# Patient Record
Sex: Female | Born: 1960
Health system: Southern US, Community
[De-identification: ages and names within clinical notes are randomized; demographics above are authoritative.]

## PROBLEM LIST (undated history)

## (undated) DIAGNOSIS — Z87898 Personal history of other specified conditions: Secondary | ICD-10-CM

## (undated) DIAGNOSIS — M5030 Other cervical disc degeneration, unspecified cervical region: Secondary | ICD-10-CM

## (undated) DIAGNOSIS — E559 Vitamin D deficiency, unspecified: Secondary | ICD-10-CM

## (undated) DIAGNOSIS — M1990 Unspecified osteoarthritis, unspecified site: Secondary | ICD-10-CM

## (undated) DIAGNOSIS — Z8742 Personal history of other diseases of the female genital tract: Secondary | ICD-10-CM

## (undated) DIAGNOSIS — Z860100 Personal history of colon polyps, unspecified: Secondary | ICD-10-CM

## (undated) DIAGNOSIS — Z973 Presence of spectacles and contact lenses: Secondary | ICD-10-CM

## (undated) DIAGNOSIS — M7062 Trochanteric bursitis, left hip: Secondary | ICD-10-CM

## (undated) DIAGNOSIS — R112 Nausea with vomiting, unspecified: Secondary | ICD-10-CM

## (undated) DIAGNOSIS — K589 Irritable bowel syndrome without diarrhea: Secondary | ICD-10-CM

## (undated) DIAGNOSIS — Z86018 Personal history of other benign neoplasm: Secondary | ICD-10-CM

## (undated) DIAGNOSIS — K648 Other hemorrhoids: Secondary | ICD-10-CM

## (undated) DIAGNOSIS — Z9889 Other specified postprocedural states: Secondary | ICD-10-CM

## (undated) DIAGNOSIS — D1720 Benign lipomatous neoplasm of skin and subcutaneous tissue of unspecified limb: Secondary | ICD-10-CM

## (undated) DIAGNOSIS — K439 Ventral hernia without obstruction or gangrene: Secondary | ICD-10-CM

## (undated) DIAGNOSIS — Q600 Renal agenesis, unilateral: Secondary | ICD-10-CM

## (undated) HISTORY — DX: Benign lipomatous neoplasm of skin and subcutaneous tissue of unspecified limb: D17.20

## (undated) HISTORY — PX: LAPAROSCOPIC VAGINAL HYSTERECTOMY WITH SALPINGO OOPHORECTOMY: SHX6681

## (undated) HISTORY — PX: TONSILLECTOMY: SUR1361

## (undated) HISTORY — DX: Irritable bowel syndrome, unspecified: K58.9

## (undated) HISTORY — PX: OTHER SURGICAL HISTORY: SHX169

## (undated) HISTORY — PX: COLONOSCOPY: SHX174

---

## 1991-11-03 HISTORY — PX: DILATION AND CURETTAGE OF UTERUS: SHX78

## 1998-08-09 ENCOUNTER — Other Ambulatory Visit: Admission: RE | Admit: 1998-08-09 | Discharge: 1998-08-09 | Payer: Self-pay | Admitting: Gynecology

## 1999-01-14 ENCOUNTER — Ambulatory Visit (HOSPITAL_COMMUNITY): Admission: RE | Admit: 1999-01-14 | Discharge: 1999-01-14 | Payer: Self-pay | Admitting: Gastroenterology

## 1999-07-31 ENCOUNTER — Emergency Department (HOSPITAL_COMMUNITY): Admission: EM | Admit: 1999-07-31 | Discharge: 1999-08-01 | Payer: Self-pay | Admitting: *Deleted

## 1999-07-31 ENCOUNTER — Inpatient Hospital Stay (HOSPITAL_COMMUNITY): Admission: AD | Admit: 1999-07-31 | Discharge: 1999-07-31 | Payer: Self-pay | Admitting: Gynecology

## 1999-09-23 ENCOUNTER — Other Ambulatory Visit: Admission: RE | Admit: 1999-09-23 | Discharge: 1999-09-23 | Payer: Self-pay | Admitting: Gynecology

## 2000-10-05 ENCOUNTER — Other Ambulatory Visit: Admission: RE | Admit: 2000-10-05 | Discharge: 2000-10-05 | Payer: Self-pay | Admitting: Gynecology

## 2001-10-13 ENCOUNTER — Other Ambulatory Visit: Admission: RE | Admit: 2001-10-13 | Discharge: 2001-10-13 | Payer: Self-pay | Admitting: Gynecology

## 2003-04-03 ENCOUNTER — Other Ambulatory Visit: Admission: RE | Admit: 2003-04-03 | Discharge: 2003-04-03 | Payer: Self-pay | Admitting: Gynecology

## 2003-05-11 LAB — HM COLONOSCOPY: HM Colonoscopy: NORMAL

## 2003-09-10 ENCOUNTER — Emergency Department (HOSPITAL_COMMUNITY): Admission: EM | Admit: 2003-09-10 | Discharge: 2003-09-10 | Payer: Self-pay | Admitting: *Deleted

## 2004-05-08 ENCOUNTER — Other Ambulatory Visit: Admission: RE | Admit: 2004-05-08 | Discharge: 2004-05-08 | Payer: Self-pay | Admitting: Gynecology

## 2005-05-14 ENCOUNTER — Other Ambulatory Visit: Admission: RE | Admit: 2005-05-14 | Discharge: 2005-05-14 | Payer: Self-pay | Admitting: Gynecology

## 2007-01-06 ENCOUNTER — Ambulatory Visit: Payer: Self-pay

## 2007-05-11 LAB — HM PAP SMEAR

## 2008-04-05 ENCOUNTER — Encounter: Payer: Self-pay | Admitting: Gynecology

## 2008-04-05 ENCOUNTER — Ambulatory Visit (HOSPITAL_BASED_OUTPATIENT_CLINIC_OR_DEPARTMENT_OTHER): Admission: RE | Admit: 2008-04-05 | Discharge: 2008-04-06 | Payer: Self-pay | Admitting: Gynecology

## 2008-08-16 ENCOUNTER — Encounter: Admission: RE | Admit: 2008-08-16 | Discharge: 2008-08-16 | Payer: Self-pay | Admitting: Family Medicine

## 2009-11-02 DIAGNOSIS — IMO0002 Reserved for concepts with insufficient information to code with codable children: Secondary | ICD-10-CM

## 2009-11-02 HISTORY — DX: Reserved for concepts with insufficient information to code with codable children: IMO0002

## 2009-11-02 HISTORY — PX: NEPHRECTOMY LIVING DONOR: SUR877

## 2010-04-02 ENCOUNTER — Ambulatory Visit: Payer: Self-pay

## 2011-03-17 NOTE — Op Note (Signed)
[name], [name]                 ACCOUNT NO.:  [id number]   MEDICAL RECORD NO.:  [id number]          PATIENT TYPE:  AMB   LOCATION:  [facility]                         FACILITY:  [facility]   PHYSICIAN:  Timothy P. Fontaine, M.D.DATE OF BIRTH:  02/10/1961   DATE OF PROCEDURE:  04/05/2008  DATE OF DISCHARGE:                               OPERATIVE REPORT   PREOPERATIVE DIAGNOSIS:  Increasing dysmenorrhea, menorrhagia, uterine  prolapse, leiomyoma, endometriosis, left ovarian cyst   POSTOPERATIVE DIAGNOSIS:  Increasing dysmenorrhea, menorrhagia, uterine  prolapse, leiomyoma, history of endometriosis.   PROCEDURE:  Laparoscopic-assisted vaginal hysterectomy, bilateral  salpingo-oophorectomy, fulguration of endometriosis.   SURGEON:  Timothy P. Fontaine, M.D.   ASSISTANT:  Rande Brunt. Eda Paschal, M.D.   ANESTHETIC:  General.   COMPLICATIONS:  None.   ESTIMATED BLOOD LOSS:  100 mL.   SPECIMEN:  Uterus, right and left fallopian tubes, right and left  ovaries.   FINDINGS:  EUA:  External BUS, vagina normal.  Cervix normal.  Uterus  grossly normal in size, retroverted.  Adnexa without gross masses.  Laparoscopic:  Anterior cul-de-sac normal.  Posterior cul-de-sac normal.  Uterus globoid, upper limits of normal size.  Right and left fallopian  tube segments grossly normal.  Right and left ovaries grossly normal,  free and mobile.  No evidence of prior ovarian cyst.  Small pigmented  superficial peritoneal lesion consistent with endometriosis.  Left  pelvic sidewall fulgurated.  Upper abdominal exam was grossly normal.  Liver smooth, no abnormalities.  Gallbladder grossly normal.  Appendix  not visualized.   PROCEDURE:  The patient was taken to the operating room, underwent  general anesthesia, and was placed low dorsal lithotomy position.  Received and abdominal, perineal, and vaginal preparation with Betadine  solution.  EUA performed.  Hulka tenaculum placed on the cervix, and a  Foley  catheter placed in sterile technique.  The patient was draped in  the usual fashion.  A repeat transverse infraumbilical incision was  made, and using the 10-mm OptiVu laparoscope the abdomen was directly  entered without difficulty.  The abdomen was then insufflated.  Right  and left 5 mm suprapubic ports were then placed under direct  visualization after transillumination for the vessels without  difficulty.  Examination of the pelvic organs, upper abdominal exam was  carried out with findings noted above.  Using the harmonic scalpel, the  left infundibulopelvic ligament and vessels were identified.  The pelvic  sidewall anatomy was reviewed.  Ureter found to be away from the  operative site, and the pedicle was transected after clipping technique  without difficulty.  The adnexa was freed through progressive  transection with the harmonic scalpel of the broad ligament to the level  of the round ligament, which was then transected.  The anterior  peritoneal vesicouterine fold was then sharply incised using the  harmonic scalpel to the midline.  A similar procedure was carried out on  the other side.  Attention was then turned to the vaginal portion of the  case.  The patient was placed in the high dorsal lithotomy position.  The Hulka tenaculum  was removed.  A weighted speculum was placed.  Cervix grasped with a Jacobs tenaculum, and the cervical mucosa was  circumferentially injected with a lidocaine/epinephrine mixture.  The  cervical mucosa was then circumferentially incised with a scalpel, and  the paracervical planes were sharply developed.  The posterior cul-de-  sac was then sharply entered.  A long weighted speculum was placed, and  the right and left uterosacral ligaments were then clamped, cut, and  ligated using 0 Vicryl suture and tagged for future reference.  The  anterior vesicouterine plane was sharply developed, and the anterior cul-  de-sac was ultimately entered without  difficulty, and the uterus was  progressively freed from its attachments through clamping, cutting, and  ligating of the paracervical cardinal ligaments, parametrial tissues.  The uterine vessels were ligated separately bilaterally.  Ultimately,  the uterus was delivered through the vagina, and the remaining  parametrial tissues were clamped and cut, ligated using 0 Vicryl suture,  and the specimen was sent to pathology.  The long weighted speculum was  replaced with a shorter weighted speculum, and the posterior vaginal  cuff grasped with Allis clamp.  A tagged tail sponge was used to pack  the intestines from the posterior cul-de-sac, and the posterior vaginal  cuff was run from uterosacral ligament to uterosacral ligament using 0  Vicryl suture in a running-interlocking stitch.  The bowel packing was  removed, and the vagina was closed anterior to posterior using 0 Vicryl  suture in an interrupted figure-of-eight stitch.  Vagina was irrigated.  Hemostasis visualized.  Speculum was removed.  Patient placed in the low  dorsal lithotomy position.  The abdomen was reinsufflated, with some  clot noted in the right adnexa.  The right infundibulopelvic vessels  were oozing.  This was irrigated, and subsequently bipolar cautery was  applied after identifying the ureter away from the cautery site.  Ultimate hemostasis was achieved.  The pelvis was copiously irrigated.  The gas was slowly allowed to escape, and hemostasis was visualized in  all surgical areas under a low pressure situation.  The small pigmented  area in the left pelvic sidewall was superficially cauterized.  The  right and left 5 mm ports were removed.  Hemostasis was visualized, and  the infraumbilical port was then backed out under direct visualization,  showing adequate hemostasis and no evidence of hernia formation.  All  skin incisions were injected using 0.25% Marcaine.  A 0 Vicryl  subcutaneous fascial stitch was placed  infraumbilically, and all skin  incisions closed using 4-0 plain interrupted cuticular stitch.  Sterile  dressings were applied.  The patient was placed in the supine position,  awakened without difficulty, and taken to the recovery room in good  condition, having tolerated the procedure well.      Timothy P. Fontaine, M.D.  Electronically Signed     TPF/MEDQ  D:  04/05/2008  T:  04/05/2008  Job:  [id number]

## 2011-03-17 NOTE — H&P (Signed)
[name], [name]                   ACCOUNT NO.:  [id number]   MEDICAL RECORD NO.:  [id number]          PATIENT TYPE:  AMB   LOCATION:  [facility]                         FACILITY:  [facility]   PHYSICIAN:  Timothy P. Fontaine, M.D.DATE OF BIRTH:  1960-11-15   DATE OF ADMISSION:  DATE OF DISCHARGE:                              HISTORY & PHYSICAL   DATE OF SURGERY:  April 05, 2008, at 7:30, [facility].   CHIEF COMPLAINT:  1. Increasing dysmenorrhea.  2. Menorrhagia.  3. Uterine prolapse.  4. Leiomyoma.  5. History of endometriosis.  6. Left ovarian cyst.   HISTORY OF PRESENT ILLNESS:  A 50 year old G19, P2, AB 2, female,  vasectomy birth control, history of increasing dysmenorrhea,  menorrhagia, now notes something protruding through her vagina with  pressure symptoms, past history of endometriosis laparoscopically  diagnosed.  Physical exam shows some uterine prolapse.  Sonohystogram  reveals multiple small myomas, 54-mm, simple, echo-free, negative color  flow cyst, left ovary, endometrial sampling, proliferative endometrium  for LAVH/BSO.   PAST MEDICAL HISTORY:  1. IBS.  2. PMDD.   PAST SURGICAL HISTORY:  1. Tonsil and adenoidectomy.  2. D&C.  3. Laparoscopy x3.   CURRENT MEDICATIONS:  1. MiraLax.  2. Pristiq.   ALLERGIES:  No medications.   REVIEW OF SYSTEMS:  Noncontributory.   FAMILY HISTORY:  Noncontributory.   SOCIAL HISTORY:  Noncontributory.   ADMISSION PHYSICAL EXAM:  VITAL SIGNS:  Afebrile.  Stable.  HEENT:  Normal.  LUNGS:  Clear.  CARDIAC:  Regular rate.  No rubs, murmurs, or gallops.  ABDOMINAL EXAM:  Benign.  PELVIC:  External BUS and vagina normal.  Cervix grossly normal.  Uterus  retroverted, upper limits of normal size, midline and mobile.  Adnexa  without gross masses or tenderness.   ASSESSMENT:  A 50 year old G59 P2 female, vasectomy birth control,  worsening dysmenorrhea, menorrhagia, history of endometriosis.  Ultrasound, multiple  small myomas, simple echo-free cyst, 54 mm, left  ovary, negative color flow, approximately a month prior to surgery, mild  uterine prolapse with symptoms of pelvic pressure, something protruding  through the vagina for LAVH/BSO.  The proposed surgery, risks, benefits,  indications, and alternatives were reviewed with the patient to include  more conservative approaches such as hormonal manipulation, IUD,  endometrial ablation, hysterectomy.  The acute intraoperative  postoperative risks, long-term, short-term issues associated with  hysterectomy were reviewed with the patient.  She understands the  absolute irreversible sterility associated with hysterectomy and accepts  this.  The ovarian conservation issue was reviewed with her and the  options of removing both ovaries, as well as keeping both ovaries was  discussed with her and the issues with both decisions discussed.  She  understands by removing both ovaries she will become hypoestrogenic and  the long-term issues of hypoestrogenism was discussed to include  cardiovascular issues of bone health, as well as symptoms such as hot  flashes, night sweats, vaginal dryness, dyspareunia.  The options for  estrogen replacement therapy following hysterectomy was discussed and  the [facility] Data reviewed.  The increased risks of  stroke, heart attack, DVT, as well as possible increased risks of breast  cancer all discussed with her.  The options of keeping both ovaries were  reviewed and the issue of recurrent ovarian disease requiring treatment  in the future or reoperation, as well as the risk of ovarian cancer was  discussed with her.  The patient has already thought about this and  wants both ovaries removed regardless.  She does have a simple cyst on  the left ovary visualized a month ago.  She understands this may or may  not be present and that this does not necessarily obligate removing her  ovaries and again the  patient is very adamant about having both ovaries  removed and she plans on using estrogen replacement therapy following  the hysterectomy.  The issues of estrogen replacement therapy to include  administration routes were discussed and we will plan on a transdermal  route postoperatively.  Sexuality following hysterectomy was also  reviewed and the potential for persistent orgasmic dysfunction or  persistent dyspareunia following the surgery was discussed, understood,  and accepted.  The acute intraoperative postoperative risks were  reviewed to include the perioperative risks of anesthesia, DVT,  pulmonary embolus, heart attack, and stroke were reviewed.  The risks of  infection requiring prolonged antibiotics, abscess formation, or  hematoma formation requiring reoperation and drainage of abscess or  hematomas was discussed.  Incisional complications requiring opening and  draining of incisions, closure by secondary intention were all reviewed  with her.  The patient understands we will attempt a laparoscopic  approach and the issues with laparoscopy to include insufflation, trocar  placement, multiple port sites, use of a sharp blunt dissection,  electrocautery, a harmonic scalpel, possible metal clips were all  discussed with her.  She understands that any time during her procedure  we may move towards an exploratory laparotomy and a total abdominal  hysterectomy, bilateral salpingo-oophorectomy if complications arise or  it is felt in my judgment that it is unsafe to proceed with the LAVH  approach.  Long-term issues with incisions were reviewed to include  hernia formation, as well as cosmetics, keloid formation reviewed.  Hemorrhage necessitating transfusion and the risks of transfusion were  reviewed to include transfusion reaction, hepatitis, HIV, mad cow  disease, and other unknown entities.  The risks of inadvertent injury to  internal organs including bowel, bladder, ureters,  vessels, and nerves  necessitating major exploratory reparative surgeries either immediately  recognized or delay recognized, future reparative  surgeries including bladder repair, ureteral reimplantation, or ureteral  surgeries, bowel resection, ostomy formation were all discussed,  understood, and accepted.  The patient's questions were answered to her  satisfaction.  She is ready to proceed with surgery.      Timothy P. Fontaine, M.D.  Electronically Signed     TPF/MEDQ  D:  04/02/2008  T:  04/02/2008  Job:  [id number]

## 2011-03-17 NOTE — Discharge Summary (Signed)
[name], [name]                 ACCOUNT NO.:  [id number]   MEDICAL RECORD NO.:  [id number]          PATIENT TYPE:  AMB   LOCATION:  [facility]                         FACILITY:  [facility]   PHYSICIAN:  Timothy P. Fontaine, M.D.DATE OF BIRTH:  05/16/1961   DATE OF ADMISSION:  04/05/2008  DATE OF DISCHARGE:  04/06/2008                               DISCHARGE SUMMARY   DISCHARGE DIAGNOSES:  1. Pelvic relaxation with uterine prolapse.  2. Dysmenorrhea.  3. Menorrhagia.  4. Endometriosis.  5. Leiomyoma.   PROCEDURE:  Laparoscopic-assisted vaginal hysterectomy, bilateral  salpingo-oophorectomy, fulguration of endometriosis.  Pathology pending.   HOSPITAL COURSE:  A 50 year old underwent uncomplicated LAVH/BSO,  fulguration of endometriosis April 05, 2008.  The patient's postoperative  course was uncomplicated.  She was discharged on postoperative day  number 1 ambulating well, tolerating a regular diet, voiding without  difficulty with a postoperative hemoglobin of 11.5.  The patient  received precautions, instructions and followup.   DISCHARGE MEDICATIONS:  A prescription for Darvocet-N 100, number 30, 1-  2 p.o. q.4-6 h. p.r.n. pain.   FOLLOW UP:  Will be seen in the office 1 week following discharge.      Timothy P. Fontaine, M.D.  Electronically Signed     TPF/MEDQ  D:  04/06/2008  T:  04/06/2008  Job:  [id number]

## 2011-07-30 LAB — CBC
Hemoglobin: 11.5 — ABNORMAL LOW
MCV: 95.1
RDW: 13.1

## 2011-07-30 LAB — DIFFERENTIAL
Basophils Absolute: 0
Basophils Relative: 0
Lymphocytes Relative: 14
Lymphs Abs: 1.2
Monocytes Absolute: 0.4
Neutro Abs: 6.9

## 2012-03-31 ENCOUNTER — Ambulatory Visit: Payer: Self-pay | Admitting: General Practice

## 2012-05-10 ENCOUNTER — Ambulatory Visit (INDEPENDENT_AMBULATORY_CARE_PROVIDER_SITE_OTHER): Payer: Self-pay | Admitting: Internal Medicine

## 2012-05-10 ENCOUNTER — Encounter: Payer: Self-pay | Admitting: Internal Medicine

## 2012-05-10 ENCOUNTER — Other Ambulatory Visit: Payer: Self-pay | Admitting: Internal Medicine

## 2012-05-10 VITALS — BP 110/64 | HR 79 | Temp 98.6°F | Resp 16 | Ht 63.0 in | Wt 154.3 lb

## 2012-05-10 DIAGNOSIS — E663 Overweight: Secondary | ICD-10-CM

## 2012-05-10 DIAGNOSIS — Z6825 Body mass index (BMI) 25.0-25.9, adult: Secondary | ICD-10-CM

## 2012-05-10 DIAGNOSIS — D1779 Benign lipomatous neoplasm of other sites: Secondary | ICD-10-CM

## 2012-05-10 DIAGNOSIS — K589 Irritable bowel syndrome without diarrhea: Secondary | ICD-10-CM | POA: Insufficient documentation

## 2012-05-10 DIAGNOSIS — L409 Psoriasis, unspecified: Secondary | ICD-10-CM | POA: Insufficient documentation

## 2012-05-10 DIAGNOSIS — L408 Other psoriasis: Secondary | ICD-10-CM

## 2012-05-10 DIAGNOSIS — N951 Menopausal and female climacteric states: Secondary | ICD-10-CM | POA: Insufficient documentation

## 2012-05-10 DIAGNOSIS — Z780 Asymptomatic menopausal state: Secondary | ICD-10-CM

## 2012-05-10 DIAGNOSIS — M47812 Spondylosis without myelopathy or radiculopathy, cervical region: Secondary | ICD-10-CM | POA: Insufficient documentation

## 2012-05-10 DIAGNOSIS — D1720 Benign lipomatous neoplasm of skin and subcutaneous tissue of unspecified limb: Secondary | ICD-10-CM

## 2012-05-10 DIAGNOSIS — Z905 Acquired absence of kidney: Secondary | ICD-10-CM

## 2012-05-10 LAB — LIPID PANEL
HDL Cholesterol: 63 mg/dL — ABNORMAL HIGH (ref 40–60)
Ldl Cholesterol, Calc: 126 mg/dL — ABNORMAL HIGH (ref 0–100)
Triglycerides: 179 mg/dL (ref 0–200)
VLDL Cholesterol, Calc: 36 mg/dL (ref 5–40)

## 2012-05-10 LAB — COMPREHENSIVE METABOLIC PANEL
Albumin: 4 g/dL (ref 3.4–5.0)
Alkaline Phosphatase: 86 U/L (ref 50–136)
Anion Gap: 7 (ref 7–16)
BUN: 18 mg/dL (ref 7–18)
Bilirubin,Total: 0.5 mg/dL (ref 0.2–1.0)
Chloride: 102 mmol/L (ref 98–107)
Co2: 29 mmol/L (ref 21–32)
Creatinine: 1.09 mg/dL (ref 0.60–1.30)
Glucose: 86 mg/dL (ref 65–99)
Osmolality: 277 (ref 275–301)
Potassium: 4.1 mmol/L (ref 3.5–5.1)
SGOT(AST): 29 U/L (ref 15–37)
Total Protein: 7.9 g/dL (ref 6.4–8.2)

## 2012-05-10 MED ORDER — ESTRADIOL 10 MCG VA TABS: ORAL_TABLET | VAGINAL | Status: DC

## 2012-05-10 MED ORDER — ESTRADIOL 25 MCG VA TABS: 25.0000 ug | ORAL_TABLET | Freq: Every day | VAGINAL | Status: DC

## 2012-05-10 NOTE — Assessment & Plan Note (Addendum)
Prior trials of higher dose Paxil caused somnolence  prist was uneatable,and Chelex caused worsening depression . Discussed adding low doe estrogen vaginal pill to current transdermal HRT>

## 2012-05-10 NOTE — Patient Instructions (Addendum)
Consider the Low Glycemic Index Diet and 6 smaller meals daily .  This boosts your metabolism and regulates your sugars:   7 AM Low carbohydrate Protein  Shakes (EAS Carb Control  Or Atkins ,  Available everywhere,   In  cases at [employer] )  2.5 carbs  (Add or substitute a toasted sandwhich thin w/ peanut butter)  10 AM: Protein bar by Atkins (snack size,  Chocolate lover's variety at  [employer])    Lunch: sandwich on pita bread or flatbread (Joseph's makes a pita bread and a flat bread , available at [employer] and [employer]; Toufayah makes a low carb flatbread available at [employer] and [employer]) Mission makes a low carb whole wheat tortilla available at [employer] most grocery stores   3 PM:  Mid day :  Another protein bar,  Or a  cheese stick, 1/4 cup of almonds, walnuts, pistachios, pecans, peanuts,  Macadamia nuts  6 PM  Dinner:  "mean and green:"  Meat/chicken/fish, salad, and green veggie : use ranch, vinagrette,  Blue cheese, etc  9 PM snack : Breyer's low carb fudgsicle or  ice cream bar (Carb Smart), or  Weight Watcher's ice cream bar , or another protein shake  Dannon's Light n fit greek yogurt is 80 cal and 8 carbs,  Make your substitutions < 15 grams carbs   Referral to Dr.  Stephens November for your lipoma
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
## 2012-05-11 ENCOUNTER — Encounter: Payer: Self-pay | Admitting: Internal Medicine

## 2012-05-11 ENCOUNTER — Telehealth: Payer: Self-pay | Admitting: Internal Medicine

## 2012-05-11 DIAGNOSIS — Z905 Acquired absence of kidney: Secondary | ICD-10-CM | POA: Insufficient documentation

## 2012-05-11 DIAGNOSIS — E663 Overweight: Secondary | ICD-10-CM | POA: Insufficient documentation

## 2012-05-11 MED ORDER — CLOBETASOL PROPIONATE 0.05 % EX LOTN: TOPICAL_LOTION | CUTANEOUS | Status: DC

## 2012-05-11 NOTE — Assessment & Plan Note (Signed)
Secondary to patient donating one to relative.  Avoiding nephrotoxic agents. limits use of NSAIDs to once a week.

## 2012-05-11 NOTE — Assessment & Plan Note (Signed)
I have addressed  BMI and recommended a low glycemic index diet utilizing smaller more frequent meals to increase metabolism.  I have also recommended that patient start exercising with a goal of 30 minutes of aerobic exercise a minimum of 5 days per week. Screening for lipid disorders, thyroid and diabetes to be done today.   

## 2012-05-11 NOTE — Telephone Encounter (Signed)
Cholesterol is fine,  LDL is 126 HDL is 63 trigs  179.  CMET is normal:  Her fasting Glucose is fine at 86. Mg kevel is fine at 2.1 (range is normal to 2.4) on current magnesium dose;   and thyroid  normal too.

## 2012-05-11 NOTE — Assessment & Plan Note (Signed)
Trial of clobetasol lotion.
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
## 2012-05-12 NOTE — Telephone Encounter (Signed)
Patient notified, she will call back in a few weeks once the report has been scanned in to get a copy of them.

## 2012-05-24 ENCOUNTER — Encounter: Payer: Self-pay | Admitting: Internal Medicine

## 2012-07-18 ENCOUNTER — Other Ambulatory Visit: Payer: Self-pay | Admitting: Internal Medicine

## 2012-07-18 MED ORDER — PAROXETINE HCL 20 MG PO TABS: 20.0000 mg | ORAL_TABLET | ORAL | Status: DC

## 2012-07-18 MED ORDER — ESTRADIOL 0.025 MG/24HR TD PTTW: 1.0000 | MEDICATED_PATCH | TRANSDERMAL | Status: DC

## 2012-08-04 ENCOUNTER — Telehealth: Payer: Self-pay

## 2012-08-04 NOTE — Telephone Encounter (Signed)
We have only one, scanned in from [facility] .  Ok to send

## 2012-08-04 NOTE — Telephone Encounter (Signed)
[name] left a message stating that pt donated a kidney and they need all of pts creatine reports for the last year.  Please fax to Lisette Abu [phone]

## 2012-08-09 NOTE — Telephone Encounter (Signed)
Labs faxed to [name] [phone]

## 2012-12-19 LAB — HM MAMMOGRAPHY

## 2013-03-17 ENCOUNTER — Telehealth: Payer: Self-pay | Admitting: Internal Medicine

## 2013-03-17 NOTE — Telephone Encounter (Addendum)
Patient requesting to see if you will accept her two sons into our practice considering you are not taking new patients. Please advise. Other son is 53 yr old. Please advise  *[facility] has taken the 53 yr old. 5/16/arb

## 2013-03-18 NOTE — Telephone Encounter (Signed)
Yes,  I will open my practice to all family members of established patients

## 2013-03-21 NOTE — Telephone Encounter (Signed)
Left a message on Soley's home phone stating [facility] would accept the 53 yr old son.

## 2013-05-10 ENCOUNTER — Encounter: Payer: Self-pay | Admitting: Internal Medicine

## 2013-05-10 ENCOUNTER — Ambulatory Visit (INDEPENDENT_AMBULATORY_CARE_PROVIDER_SITE_OTHER): Payer: 59 | Admitting: Internal Medicine

## 2013-05-10 VITALS — BP 103/70 | HR 64 | Temp 98.1°F | Resp 14 | Ht 63.0 in | Wt 139.5 lb

## 2013-05-10 DIAGNOSIS — N951 Menopausal and female climacteric states: Secondary | ICD-10-CM

## 2013-05-10 DIAGNOSIS — K589 Irritable bowel syndrome without diarrhea: Secondary | ICD-10-CM

## 2013-05-10 DIAGNOSIS — Z1211 Encounter for screening for malignant neoplasm of colon: Secondary | ICD-10-CM

## 2013-05-10 DIAGNOSIS — Z79890 Hormone replacement therapy (postmenopausal): Secondary | ICD-10-CM

## 2013-05-10 DIAGNOSIS — E663 Overweight: Secondary | ICD-10-CM

## 2013-05-10 DIAGNOSIS — Z6825 Body mass index (BMI) 25.0-25.9, adult: Secondary | ICD-10-CM

## 2013-05-10 DIAGNOSIS — Z0000 Encounter for general adult medical examination without abnormal findings: Secondary | ICD-10-CM

## 2013-05-10 NOTE — Progress Notes (Signed)
Patient ID: [name], female   DOB: [birth year], 52 y.o.   MRN: [id number]   Subjective:     [name] is a 52 y.o. female and is here for a comprehensive physical exam. The patient reports no problems.  History   Social History  . Marital Status: Married    Spouse Name: N/A    Number of Children: N/A  . Years of Education: N/A   Occupational History  . Not on file.   Social History Main Topics  . Smoking status: Never Smoker   . Smokeless tobacco: Never Used  . Alcohol Use: Yes  . Drug Use: No  . Sexually Active: Not on file   Other Topics Concern  . Not on file   Social History Narrative  . No narrative on file   Health Maintenance  Topic Date Due  . Tetanus/tdap  04/12/1979  . Pap Smear  05/10/2010  . Mammogram  05/10/2013  . Colonoscopy  05/10/2013  . Influenza Vaccine  07/03/2013    The following portions of the patient's history were reviewed and updated as appropriate: allergies, current medications, past family history, past medical history, past social history, past surgical history and problem list.  Review of Systems A comprehensive review of systems was negative.   Objective:   BP 103/70  Pulse 64  Temp(Src) 98.1 F (36.7 C) (Oral)  Resp 14  Ht 5\' 3"  (1.6 m)  Wt 139 lb 8 oz (63.277 kg)  BMI 24.72 kg/m2  SpO2 98%  General Appearance:    Alert, cooperative, no distress, appears stated age  Head:    Normocephalic, without obvious abnormality, atraumatic  Eyes:    PERRL, conjunctiva/corneas clear, EOM's intact, fundi    benign, both eyes  Ears:    Normal TM's and external ear canals, both ears  Nose:   Nares normal, septum midline, mucosa normal, no drainage    or sinus tenderness  Throat:   Lips, mucosa, and tongue normal; teeth and gums normal  Neck:   Supple, symmetrical, trachea midline, no adenopathy;    thyroid:  no enlargement/tenderness/nodules; no carotid   bruit or JVD  Back:     Symmetric, no curvature, ROM normal, no CVA  tenderness  Lungs:     Clear to auscultation bilaterally, respirations unlabored  Chest Wall:    No tenderness or deformity   Heart:    Regular rate and rhythm, S1 and S2 normal, no murmur, rub   or gallop  Breast Exam:    No tenderness, masses, or nipple abnormality  Abdomen:     Soft, non-tender, bowel sounds active all four quadrants,    no masses, no organomegaly  Genitalia:    Pelvic:no adnexal masses or tenderness, no cervical motion tenderness, rectovaginal septum normal, uterus surgically absent   Extremities:   Extremities normal, atraumatic, no cyanosis or edema  Pulses:   2+ and symmetric all extremities  Skin:   Skin color, texture, turgor normal, no rashes or lesions large mobile lipoma , left anterior thigh  Lymph nodes:   Cervical, supraclavicular, and axillary nodes normal  Neurologic:   CNII-XII intact, normal strength, sensation and reflexes    throughout    Assessment:   Overweight (BMI 25.0-29.9) Resolved with wt loss of 25 lbs on a 800 cal low carb diet imposed by Dr. Evelene Miller.  Achieved without regular exercise. Diet discussed,  The cost was prohibitive .  Alternatives given.   IBS (irritable bowel syndrome) Given several dietary suggestions to increased  daily fiber intake to 25 to 35 g daily. Need for screening colonoscopy discussed again and referral made to Dr Bluford Kaufmann  Menopausal syndrome on hormone replacement therapy Her Vivelle patch is  Causing local irritation regularly prior to changing.  Discussed trial of daily oral estradiol as she is s/p TAH  Routine general medical examination at a health care facility Annual comprehensive exam was done including breast, pelvic without  PAP smear. All screenings have been addressed .    Updated Medication List Outpatient Encounter Prescriptions as of 05/10/2013  Medication Sig Dispense Refill  . estradiol (VIVELLE-DOT) 0.025 MG/24HR Place 1 patch onto the skin once a week.  12 patch  3  . ibuprofen (ADVIL,MOTRIN)  200 MG tablet Take 200 mg by mouth every 6 (six) hours as needed.      . loratadine (CLARITIN) 10 MG tablet Take 10 mg by mouth daily.      . magnesium oxide (MAG-OX) 400 MG tablet Take 400 mg by mouth 2 (two) times daily.      . Misc Natural Products (OSTEO BI-FLEX ADV DOUBLE ST) CAPS Take by mouth.      Marland Kitchen PARoxetine (PAXIL) 20 MG tablet Take 1 tablet (20 mg total) by mouth every morning.  30 tablet  3  . Potassium 99 MG TABS Take by mouth 2 (two) times daily.      . Clobetasol Propionate 0.05 % lotion Apply to affected area two times daily as needed  59 mL  0  . estradiol (ESTRACE) 1 MG tablet Take 1 tablet (1 mg total) by mouth daily.  90 tablet  1  . Estradiol (VAGIFEM) 10 MCG TABS Place vaginally daily for two weeks, then one weekly  18 tablet  0  . Methylsulfonylmethane (MSM) 1000 MG CAPS Take by mouth.       No facility-administered encounter medications on file as of 05/10/2013.

## 2013-05-10 NOTE — Patient Instructions (Addendum)
GET YOUR MAMMOMGRAM!!  Referral to Dr Bluford Kaufmann for your colonoscopy  This is my version of a  "Low GI"  Diet:     All of the foods can be found at grocery stores and in bulk at [employer].  The Atkins protein bars and shakes are available in more varieties at Target, WalMart and [employer] Foods.     7 AM Breakfast:  Choose from the following:  Low carbohydrate Protein  Shakes (I recommend the EAS AdvantEdge "Carb Control" shakes  Or the low carb shakes by Atkins.    2.5 carbs   Arnold's "Sandwhich Thin"toasted  w/ peanut butter (no jelly: about 20 net carbs  "Bagel Thin" with cream cheese and salmon: about 20 carbs   a scrambled egg/bacon/cheese burrito made with Mission's "carb balance" whole wheat tortilla  (about 10 net carbs )   Avoid cereal and bananas, oatmeal and cream of wheat and grits. They are loaded with carbohydrates!   10 AM: high protein snack  Protein bar by Atkins (the snack size, under 200 cal, usually < 6 net carbs).    A stick of cheese:  Around 1 carb,  100 cal     Dannon Light n Fit [place] Yogurt  (80 cal, 8 carbs)  Other so called "protein bars" and Greek yogurts tend to be loaded with carbohydrates.  Remember, in food advertising, the word "energy" is synonymous for " carbohydrate."  Lunch:   A Sandwich using the bread choices listed, Can use any  Eggs,  lunchmeat, grilled meat or canned tuna), avocado, regular mayo/mustard  and cheese.  A Salad using blue cheese, ranch,  Goddess or vinagrette,  No croutons or "confetti" and no "candied nuts" but regular nuts OK.   No pretzels or chips.  Pickles and miniature sweet peppers are a good low carb alternative that provide a "crunch"  The bread is the only source of carbohydrate in a sandwich and  can be decreased by trying some of these alternatives to traditional loaf bread  Joseph's makes a pita bread and a flat bread that are 50 cal and 4 net carbs available at BJs and WalMart.  This can be toasted to use with hummous as  well  Toufayan makes a low carb flatbread that's 100 cal and 9 net carbs available at [employer] and [employer] makes 2 sizes of  Low carb whole wheat tortilla  (The large one is 210 cal and 6 net carbs) Avoid "Low fat dressings, as well as Reyne Dumas and [address] dressings They are loaded with sugar!   3 PM/ Mid day  Snack:  Consider  1 ounce of  almonds, walnuts, pistachios, pecans, peanuts,  Macadamia nuts or a nut medley.  Avoid "granola"; the dried cranberries and raisins are loaded with carbohydrates. Mixed nuts as long as there are no raisins,  cranberries or dried fruit.     6 PM  Dinner:     Meat/fowl/fish with a green salad, and either broccoli, cauliflower, green beans, spinach, brussel sprouts or  Lima beans. DO NOT BREAD THE PROTEIN!!      There is a low carb pasta by Dreamfield's that is acceptable and tastes great: only 5 digestible carbs/serving.( All grocery stores but BJs carry it )  Try Kai Levins Angelo's chicken piccata or chicken or eggplant parm over low carb pasta.(Lowes and BJs)   Clifton Custard Sanchez's "Carnitas" (pulled pork, no sauce,  0 carbs) or his beef pot roast to make a dinner burrito (  at BJ's)  Pesto over low carb pasta (bj's sells a good quality pesto in the center refrigerated section of the deli   Whole wheat pasta is still full of digestible carbs and  Not as low in glycemic index as Dreamfield's.   Brown rice is still rice,  So skip the rice and noodles if you eat [place] or [place] (or at least limit to 1/2 cup)  9 PM snack :   Breyer's "low carb" fudgsicle or  ice cream bar (Carb Smart line), or  Weight Watcher's ice cream bar , or another "no sugar added" ice cream;  a serving of fresh berries/cherries with whipped cream   Cheese or DANNON'S LlGHT N FIT [place] YOGURT  Avoid bananas, pineapple, grapes  and watermelon on a regular basis because they are high in sugar.  THINK OF THEM AS DESSERT  Remember that snack Substitutions should be less than 10 NET  carbs per serving and meals < 20 carbs. Remember to subtract fiber grams to get the "net carbs."

## 2013-05-11 ENCOUNTER — Encounter: Payer: Self-pay | Admitting: Internal Medicine

## 2013-05-11 DIAGNOSIS — Z0000 Encounter for general adult medical examination without abnormal findings: Secondary | ICD-10-CM | POA: Insufficient documentation

## 2013-05-11 MED ORDER — ESTRADIOL 1 MG PO TABS: 1.0000 mg | ORAL_TABLET | Freq: Every day | ORAL | Status: DC

## 2013-05-11 NOTE — Assessment & Plan Note (Signed)
Her Vivelle patch is  Causing local irritation regularly prior to changing.  Discussed trial of daily oral estradiol as she is s/p TAH

## 2013-05-11 NOTE — Assessment & Plan Note (Signed)
Resolved with wt loss of 25 lbs on a 800 cal low carb diet imposed by Dr. Evelene Croon.  Achieved without regular exercise. Diet discussed,  The cost was prohibitive .  Alternatives given.

## 2013-05-11 NOTE — Assessment & Plan Note (Signed)
Annual comprehensive exam was done including breast, pelvic without PAP smear. All screenings have been addressed .  

## 2013-05-11 NOTE — Assessment & Plan Note (Signed)
Given several dietary suggestions to increased daily fiber intake to 25 to 35 g daily. Need for screening colonoscopy discussed again and referral made to Dr Bluford Kaufmann

## 2013-07-18 ENCOUNTER — Other Ambulatory Visit: Payer: Self-pay | Admitting: Internal Medicine

## 2013-07-18 LAB — LIPID PANEL
Ldl Cholesterol, Calc: 99 mg/dL (ref 0–100)
Triglycerides: 114 mg/dL (ref 0–200)
VLDL Cholesterol, Calc: 23 mg/dL (ref 5–40)

## 2013-07-18 LAB — TSH: Thyroid Stimulating Horm: 1.23 u[IU]/mL

## 2013-07-18 LAB — CBC WITH DIFFERENTIAL/PLATELET
Eosinophil #: 0.1 10*3/uL (ref 0.0–0.7)
HGB: 14.2 g/dL (ref 12.0–16.0)
Lymphocyte #: 1.5 10*3/uL (ref 1.0–3.6)
Lymphocyte %: 26.5 %
MCH: 32.8 pg (ref 26.0–34.0)
MCV: 92 fL (ref 80–100)
Neutrophil #: 3.7 10*3/uL (ref 1.4–6.5)
Platelet: 166 10*3/uL (ref 150–440)
RBC: 4.34 10*6/uL (ref 3.80–5.20)
WBC: 5.6 10*3/uL (ref 3.6–11.0)

## 2013-07-18 LAB — COMPREHENSIVE METABOLIC PANEL
Alkaline Phosphatase: 83 U/L (ref 50–136)
Anion Gap: 3 — ABNORMAL LOW (ref 7–16)
BUN: 17 mg/dL (ref 7–18)
Bilirubin,Total: 0.5 mg/dL (ref 0.2–1.0)
Calcium, Total: 9.1 mg/dL (ref 8.5–10.1)
Creatinine: 0.93 mg/dL (ref 0.60–1.30)
EGFR (African American): >60
EGFR (Non-African Amer.): >60
Osmolality: 275 (ref 275–301)
Potassium: 4.3 mmol/L (ref 3.5–5.1)
SGOT(AST): 27 U/L (ref 15–37)
Sodium: 137 mmol/L (ref 136–145)
Total Protein: 7.3 g/dL (ref 6.4–8.2)

## 2013-07-20 ENCOUNTER — Telehealth: Payer: Self-pay | Admitting: Internal Medicine

## 2013-07-20 LAB — BASIC METABOLIC PANEL
Creatinine: 0.9 mg/dL (ref 0.5–1.1)
Potassium: 4.3 mmol/L (ref 3.4–5.3)
Sodium: 137 mmol/L (ref 137–147)

## 2013-07-20 LAB — LIPID PANEL: HDL: 81 mg/dL — AB (ref 35–70)

## 2013-07-20 LAB — CBC AND DIFFERENTIAL
HCT: 40 % (ref 36–46)
WBC: 5.6 10^3/mL

## 2013-07-20 LAB — HEPATIC FUNCTION PANEL: ALT: 28 U/L (ref 7–35)

## 2013-07-20 NOTE — Telephone Encounter (Signed)
All labs normal,. Including thyroid function and cholesterol   

## 2013-07-27 NOTE — Telephone Encounter (Signed)
Left message, notifying of results.  

## 2013-08-03 ENCOUNTER — Encounter: Payer: Self-pay | Admitting: Internal Medicine

## 2013-08-15 ENCOUNTER — Other Ambulatory Visit: Payer: Self-pay | Admitting: *Deleted

## 2013-08-16 MED ORDER — PAROXETINE HCL 20 MG PO TABS: 20.0000 mg | ORAL_TABLET | ORAL | Status: DC

## 2013-09-12 ENCOUNTER — Encounter: Payer: Self-pay | Admitting: Adult Health

## 2013-09-12 ENCOUNTER — Ambulatory Visit (INDEPENDENT_AMBULATORY_CARE_PROVIDER_SITE_OTHER): Payer: 59 | Admitting: Adult Health

## 2013-09-12 VITALS — BP 122/80 | HR 76 | Resp 16 | Wt 146.0 lb

## 2013-09-12 DIAGNOSIS — R51 Headache: Secondary | ICD-10-CM

## 2013-09-12 DIAGNOSIS — L409 Psoriasis, unspecified: Secondary | ICD-10-CM

## 2013-09-12 DIAGNOSIS — L408 Other psoriasis: Secondary | ICD-10-CM

## 2013-09-12 LAB — CBC WITH DIFFERENTIAL/PLATELET
Basophils Relative: 0.5 % (ref 0.0–3.0)
Hemoglobin: 13.7 g/dL (ref 12.0–15.0)
Lymphocytes Relative: 31.8 % (ref 12.0–46.0)
Lymphs Abs: 1.8 10*3/uL (ref 0.7–4.0)
MCHC: 34.7 g/dL (ref 30.0–36.0)
Monocytes Relative: 6.2 % (ref 3.0–12.0)
Neutro Abs: 3.3 10*3/uL (ref 1.4–7.7)
Platelets: 194 10*3/uL (ref 150.0–400.0)
WBC: 5.6 10*3/uL (ref 4.5–10.5)

## 2013-09-12 LAB — HEPATIC FUNCTION PANEL: ALT: 21 U/L (ref 0–35)

## 2013-09-12 MED ORDER — CLOBETASOL PROPIONATE 0.05 % EX LOTN: TOPICAL_LOTION | CUTANEOUS | Status: DC

## 2013-09-12 NOTE — Progress Notes (Signed)
  Subjective:    Patient ID: [name], female    DOB: January 09, 1961, 52 y.o.   MRN: [id number]  HPI  Pt is a pleasant 52 yo female, presents to clinic with left temporal pain x 2 weeks.  Pt states it started suddenly when she was at work and has not resolved. Pt has been taking Advil and Sudafed with minimal relief. She is experiencing pain radiating to her jaw. Blurred vision in left eye. Reports eye exam 3 weeks ago; however, symptoms occurred later.  Review of Systems  Constitutional: Negative.   Eyes: Positive for visual disturbance.       Blurred vision, left eye  Neurological: Positive for headaches.       Left temporal pain.       Objective:   Physical Exam  Constitutional: She is oriented to person, place, and time. She appears well-developed and well-nourished.  HENT:  Head: Normocephalic and atraumatic.  Right Ear: Tympanic membrane, external ear and ear canal normal.  Left Ear: Tympanic membrane, external ear and ear canal normal.  Neurological: She is alert and oriented to person, place, and time.  Skin: Skin is warm and dry.  Psychiatric: She has a normal mood and affect. Her behavior is normal. Judgment and thought content normal.    BP 122/80  Pulse 76  Wt 146 lb (66.225 kg)  SpO2 98%       Assessment & Plan:

## 2013-09-12 NOTE — Assessment & Plan Note (Signed)
Left temporal pain. R/O temporal arteritis - check sed rate, crp, hepatic panel, cbc

## 2013-09-13 ENCOUNTER — Telehealth: Payer: Self-pay | Admitting: Internal Medicine

## 2013-09-13 NOTE — Telephone Encounter (Signed)
Pt seen by Raquel 11/11.  Pt would like to speak with Raquel or nurse regarding her labs and the problem that she came in with yesterday, pain in temple.

## 2013-09-13 NOTE — Telephone Encounter (Signed)
Spoke with pt and discussed labs were normal. Advised on what Raquel suggested. Pt states she spoke with a physician she works with, and believe this is temporal arteritis and would like referral to Dr. Gavin Potters.

## 2013-09-14 ENCOUNTER — Other Ambulatory Visit: Payer: Self-pay | Admitting: Adult Health

## 2013-09-14 NOTE — Progress Notes (Signed)
Patient requesting referral to rheumatology for left temporal pain. Sed rate, CRP, LFTs normal. She works with a physician who believes this is temporal arteritis; therefore, patient wants to see Dr. Gavin Potters. Referral made

## 2013-09-18 ENCOUNTER — Ambulatory Visit: Payer: Self-pay | Admitting: Gastroenterology

## 2013-10-04 ENCOUNTER — Encounter (INDEPENDENT_AMBULATORY_CARE_PROVIDER_SITE_OTHER): Payer: Self-pay

## 2013-10-04 ENCOUNTER — Ambulatory Visit (INDEPENDENT_AMBULATORY_CARE_PROVIDER_SITE_OTHER): Payer: 59 | Admitting: Adult Health

## 2013-10-04 VITALS — BP 126/78 | HR 66 | Temp 97.8°F | Wt 146.0 lb

## 2013-10-04 DIAGNOSIS — R51 Headache: Secondary | ICD-10-CM

## 2013-10-04 MED ORDER — HYDROCODONE-ACETAMINOPHEN 5-325 MG PO TABS: 1.0000 | ORAL_TABLET | Freq: Four times a day (QID) | ORAL | Status: DC | PRN

## 2013-10-04 NOTE — Assessment & Plan Note (Signed)
Neuro exam normal. Suspect pain is directly related to recent dental procedure. Try ice alternating with warm compresses. Norco for pain. If no resolution of symptoms within 2-3 weeks will send for MRI.

## 2013-10-04 NOTE — Progress Notes (Signed)
Pre visit review using our clinic review tool, if applicable. No additional management support is needed unless otherwise documented below in the visit note. 

## 2013-10-04 NOTE — Patient Instructions (Signed)
  Instructions follow on how to activate your MyChart account so that you can access your medical records.  Try putting ice for 15 min - you can also use warm compresses.  Take Norco 1 tablet every 6 hours as needed for pain.

## 2013-10-04 NOTE — Progress Notes (Signed)
   Subjective:    Patient ID: [name], female    DOB: 1961/07/05, 52 y.o.   MRN: [id number]  HPI Patient is a pleasant 52 year old female who presents to clinic with left side facial pain. She was seen in clinic several weeks ago with left temporal pain status post inflammatory markers which were negative. Patient reports that she has had dental work 2 weeks ago and subsequently has developed the pain on the left side of her face. She reports having a nerve block on that side for her dental procedure. She also reports history of TMJ. Needs a new mouthguard. Also reports grandmother with history of tic douloureux.   Review of Systems  Neurological: Negative for facial asymmetry, numbness and headaches.       Left side facial pain       Objective:   Physical Exam  Constitutional: She is oriented to person, place, and time.  Neurological: She is alert and oriented to person, place, and time. She has normal strength. No cranial nerve deficit or sensory deficit.          Assessment & Plan:

## 2013-10-07 LAB — HM COLONOSCOPY

## 2013-10-10 ENCOUNTER — Ambulatory Visit: Payer: Self-pay | Admitting: Internal Medicine

## 2013-10-18 ENCOUNTER — Encounter: Payer: Self-pay | Admitting: Adult Health

## 2013-10-27 ENCOUNTER — Ambulatory Visit: Payer: Self-pay | Admitting: Internal Medicine

## 2013-10-31 ENCOUNTER — Telehealth: Payer: Self-pay | Admitting: Internal Medicine

## 2013-10-31 NOTE — Telephone Encounter (Signed)
Her MRA Was normal

## 2013-11-01 ENCOUNTER — Other Ambulatory Visit: Payer: Self-pay | Admitting: Adult Health

## 2013-11-01 MED ORDER — PREDNISONE 10 MG PO TABS: ORAL_TABLET | ORAL | Status: DC

## 2013-11-01 NOTE — Telephone Encounter (Signed)
Called and advised patient of results

## 2013-11-01 NOTE — Telephone Encounter (Signed)
Pt states continues to have the constant left temple pain, relieved by Advil, but does not want to take all the time. Wants to know what her next step is?

## 2013-11-01 NOTE — Telephone Encounter (Signed)
Since she saw raquel,  Please ask raquel what her next plan was,. otherwise patient will have to see  Me to determine next course of action

## 2013-11-03 NOTE — Telephone Encounter (Signed)
Left message, notifying pt of unread message and to call to schedule followup appt with Dr. Derrel Nip

## 2013-11-13 ENCOUNTER — Encounter: Payer: Self-pay | Admitting: Internal Medicine

## 2013-11-24 ENCOUNTER — Encounter: Payer: Self-pay | Admitting: Internal Medicine

## 2014-01-17 ENCOUNTER — Other Ambulatory Visit: Payer: Self-pay | Admitting: Internal Medicine

## 2014-03-16 ENCOUNTER — Telehealth: Payer: Self-pay | Admitting: Internal Medicine

## 2014-03-16 ENCOUNTER — Other Ambulatory Visit: Payer: Self-pay | Admitting: Adult Health

## 2014-03-16 DIAGNOSIS — R519 Headache, unspecified: Secondary | ICD-10-CM

## 2014-03-16 DIAGNOSIS — R51 Headache: Principal | ICD-10-CM

## 2014-03-16 NOTE — Telephone Encounter (Signed)
Would like referral for Rheumatology please advise.

## 2014-03-16 NOTE — Telephone Encounter (Signed)
This patient needs to be seen by me before any referral is done. . It has been too long since her last visit.  And the issue needs to be reevalulated by me

## 2014-03-16 NOTE — Telephone Encounter (Signed)
Since I did not see this patient for this problem, I cannot do the referral.  I  will send it to Raquel to consider rheumatology vs  neurology (raquel could this be trigeminal neuralgia?)

## 2014-03-16 NOTE — Telephone Encounter (Signed)
Pt states she was referred to Dr. Precious Reel previously, which she cancelled due to being told they may not be able to help with her problem.  States she is still having trouble and would like the referral again.  Best day to go is a Wednesday, any time of day.

## 2014-03-16 NOTE — Telephone Encounter (Signed)
I'll put in the referral. She should have never cancelled

## 2014-03-19 NOTE — Telephone Encounter (Signed)
Left detailed message on voicemail, patient needs to call offie and request appointment before referral is made.

## 2014-03-28 ENCOUNTER — Ambulatory Visit (INDEPENDENT_AMBULATORY_CARE_PROVIDER_SITE_OTHER): Payer: 59 | Admitting: Internal Medicine

## 2014-03-28 ENCOUNTER — Encounter: Payer: Self-pay | Admitting: Internal Medicine

## 2014-03-28 VITALS — BP 116/76 | HR 71 | Temp 98.5°F | Resp 16 | Wt 151.0 lb

## 2014-03-28 DIAGNOSIS — M130 Polyarthritis, unspecified: Secondary | ICD-10-CM | POA: Insufficient documentation

## 2014-03-28 LAB — COMPREHENSIVE METABOLIC PANEL
ALBUMIN: 3.9 g/dL (ref 3.5–5.2)
ALK PHOS: 58 U/L (ref 39–117)
ALT: 24 U/L (ref 0–35)
AST: 23 U/L (ref 0–37)
BUN: 22 mg/dL (ref 6–23)
CO2: 31 meq/L (ref 19–32)
Calcium: 9.2 mg/dL (ref 8.4–10.5)
Chloride: 99 mEq/L (ref 96–112)
Creatinine, Ser: 0.9 mg/dL (ref 0.4–1.2)
GFR: 68.48 mL/min (ref >60.00)
Glucose, Bld: 73 mg/dL (ref 70–99)
Potassium: 4.1 mEq/L (ref 3.5–5.1)
SODIUM: 137 meq/L (ref 135–145)
TOTAL PROTEIN: 6.5 g/dL (ref 6.0–8.3)
Total Bilirubin: 0.6 mg/dL (ref 0.2–1.2)

## 2014-03-28 LAB — CBC WITH DIFFERENTIAL/PLATELET
BASOS PCT: 0.4 % (ref 0.0–3.0)
Basophils Absolute: 0 10*3/uL (ref 0.0–0.1)
EOS ABS: 0.1 10*3/uL (ref 0.0–0.7)
EOS PCT: 1.6 % (ref 0.0–5.0)
HCT: 39.3 % (ref 36.0–46.0)
HEMOGLOBIN: 13.7 g/dL (ref 12.0–15.0)
Lymphocytes Relative: 31 % (ref 12.0–46.0)
Lymphs Abs: 1.5 10*3/uL (ref 0.7–4.0)
MCHC: 34.8 g/dL (ref 30.0–36.0)
MCV: 94.2 fl (ref 78.0–100.0)
MONO ABS: 0.4 10*3/uL (ref 0.1–1.0)
Monocytes Relative: 8 % (ref 3.0–12.0)
NEUTROS ABS: 2.9 10*3/uL (ref 1.4–7.7)
Neutrophils Relative %: 59 % (ref 43.0–77.0)
Platelets: 189 10*3/uL (ref 150.0–400.0)
RBC: 4.18 Mil/uL (ref 3.87–5.11)
RDW: 13.8 % (ref 11.5–15.5)
WBC: 4.9 10*3/uL (ref 4.0–10.5)

## 2014-03-28 LAB — C-REACTIVE PROTEIN: CRP: 0.7 mg/dL (ref 0.5–20.0)

## 2014-03-28 LAB — SEDIMENTATION RATE: SED RATE: 6 mm/h (ref 0–22)

## 2014-03-28 LAB — URIC ACID: URIC ACID, SERUM: 4.1 mg/dL (ref 2.4–7.0)

## 2014-03-28 LAB — FERRITIN: Ferritin: 24.6 ng/mL (ref 10.0–291.0)

## 2014-03-28 MED ORDER — TRAMADOL HCL 50 MG PO TABS: 50.0000 mg | ORAL_TABLET | Freq: Three times a day (TID) | ORAL | Status: DC | PRN

## 2014-03-28 NOTE — Patient Instructions (Signed)
You can continue ibuprofen 400 mg twice daily   You can add tylenol or tramadol to the ibuprofen for pain if needed    Referral to Dr Jefm Bryant under way

## 2014-03-28 NOTE — Progress Notes (Signed)
Pre-visit discussion using our clinic review tool. No additional management support is needed unless otherwise documented below in the visit note.  
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
## 2014-03-29 LAB — LYME ABY, WSTRN BLT IGG & IGM W/BANDS
B burgdorferi IgG Abs (IB): NEGATIVE
B burgdorferi IgM Abs (IB): NEGATIVE
LYME DISEASE 23 KD IGM: NONREACTIVE
LYME DISEASE 30 KD IGG: NONREACTIVE
LYME DISEASE 41 KD IGG: NONREACTIVE
LYME DISEASE 45 KD IGG: NONREACTIVE
LYME DISEASE 58 KD IGG: NONREACTIVE
LYME DISEASE 66 KD IGG: NONREACTIVE
Lyme Disease 18 kD IgG: NONREACTIVE
Lyme Disease 23 kD IgG: NONREACTIVE
Lyme Disease 28 kD IgG: REACTIVE — AB
Lyme Disease 39 kD IgG: REACTIVE — AB
Lyme Disease 39 kD IgM: NONREACTIVE
Lyme Disease 41 kD IgM: NONREACTIVE
Lyme Disease 93 kD IgG: NONREACTIVE

## 2014-03-29 LAB — ANA W/REFLEX IF POSITIVE
Anti JO-1: <0.2 AI (ref 0.0–0.9)
Anti Nuclear Antibody(ANA): POSITIVE — AB
Centromere Ab Screen: <0.2 AI (ref 0.0–0.9)
ENA RNP AB: 2.5 AI — AB (ref 0.0–0.9)
ENA SM AB SER-ACNC: 0.4 AI (ref 0.0–0.9)
ENA SSA (RO) AB: 0.2 AI (ref 0.0–0.9)
Scleroderma SCL-70: <0.2 AI (ref 0.0–0.9)
dsDNA Ab: 3 IU/mL (ref 0–9)

## 2014-03-29 LAB — CYCLIC CITRUL PEPTIDE ANTIBODY, IGG

## 2014-03-29 LAB — IRON AND TIBC
%SAT: 30 % (ref 20–55)
Iron: 103 ug/dL (ref 42–145)
TIBC: 348 ug/dL (ref 250–470)
UIBC: 245 ug/dL (ref 125–400)

## 2014-03-29 LAB — RHEUMATOID FACTOR

## 2014-03-30 ENCOUNTER — Encounter: Payer: Self-pay | Admitting: Internal Medicine

## 2014-03-30 LAB — HLA-B27 ANTIGEN: DNA Result:: NOT DETECTED

## 2014-03-30 NOTE — Assessment & Plan Note (Addendum)
Repeat workup for autoimmune arthritis resulted in a  positive screen for past exposure to Lyme's disease,  And positive ANA  With ENA RNP Ab level being high, suggesting a mixed connective tissue disease (which is not specific any one one disease). Referral to rheumatology is  Underway.

## 2014-04-03 NOTE — Telephone Encounter (Signed)
Mailed unread message to pt  

## 2014-07-23 ENCOUNTER — Other Ambulatory Visit: Payer: Self-pay | Admitting: Internal Medicine

## 2014-12-05 ENCOUNTER — Ambulatory Visit (INDEPENDENT_AMBULATORY_CARE_PROVIDER_SITE_OTHER): Payer: 59 | Admitting: Internal Medicine

## 2014-12-05 ENCOUNTER — Encounter: Payer: Self-pay | Admitting: Internal Medicine

## 2014-12-05 VITALS — BP 134/72 | HR 73 | Temp 98.4°F | Resp 16 | Ht 62.75 in | Wt 141.2 lb

## 2014-12-05 DIAGNOSIS — M25552 Pain in left hip: Secondary | ICD-10-CM

## 2014-12-05 DIAGNOSIS — E785 Hyperlipidemia, unspecified: Secondary | ICD-10-CM

## 2014-12-05 DIAGNOSIS — E559 Vitamin D deficiency, unspecified: Secondary | ICD-10-CM

## 2014-12-05 DIAGNOSIS — Z1239 Encounter for other screening for malignant neoplasm of breast: Secondary | ICD-10-CM

## 2014-12-05 DIAGNOSIS — Z0000 Encounter for general adult medical examination without abnormal findings: Secondary | ICD-10-CM

## 2014-12-05 DIAGNOSIS — G8929 Other chronic pain: Secondary | ICD-10-CM

## 2014-12-05 DIAGNOSIS — Z0189 Encounter for other specified special examinations: Secondary | ICD-10-CM

## 2014-12-05 DIAGNOSIS — Z79890 Hormone replacement therapy (postmenopausal): Secondary | ICD-10-CM

## 2014-12-05 DIAGNOSIS — R202 Paresthesia of skin: Secondary | ICD-10-CM

## 2014-12-05 DIAGNOSIS — N951 Menopausal and female climacteric states: Secondary | ICD-10-CM

## 2014-12-05 DIAGNOSIS — L409 Psoriasis, unspecified: Secondary | ICD-10-CM | POA: Insufficient documentation

## 2014-12-05 DIAGNOSIS — Z13820 Encounter for screening for osteoporosis: Secondary | ICD-10-CM

## 2014-12-05 DIAGNOSIS — E663 Overweight: Secondary | ICD-10-CM

## 2014-12-05 MED ORDER — FLUOCINOLONE ACETONIDE 0.01 % OT OIL: TOPICAL_OIL | OTIC | Status: DC

## 2014-12-05 NOTE — Progress Notes (Signed)
Pre-visit discussion using our clinic review tool. No additional management support is needed unless otherwise documented below in the visit note.  

## 2014-12-05 NOTE — Progress Notes (Signed)
Patient ID: [name], female   DOB: 07-Apr-1961, 54 y.o.   MRN: [id number]  Subjective:     [name] is a 54 y.o. female and is here for a comprehensive physical exam. The patient reports no problems.  History   Social History  . Marital Status: Married    Spouse Name: N/A    Number of Children: N/A  . Years of Education: N/A   Occupational History  . Not on file.   Social History Main Topics  . Smoking status: Never Smoker   . Smokeless tobacco: Never Used  . Alcohol Use: Yes  . Drug Use: No  . Sexual Activity: Not on file   Other Topics Concern  . Not on file   Social History Narrative   Health Maintenance  Topic Date Due  . Samul Dada  04/12/1979  . PAP SMEAR  05/10/2010  . INFLUENZA VACCINE  06/03/2015  . MAMMOGRAM  10/11/2015  . COLONOSCOPY  09/19/2023    The following portions of the patient's history were reviewed and updated as appropriate: allergies, current medications, past family history, past medical history, past social history, past surgical history and problem list.  Review of Systems  Patient denies headache, fevers, malaise, unintentional weight loss, skin rash, eye pain, sinus congestion and sinus pain, sore throat, dysphagia,  hemoptysis , cough, dyspnea, wheezing, chest pain, palpitations, orthopnea, edema, abdominal pain, nausea, melena, diarrhea, constipation, flank pain, dysuria, hematuria, urinary  Frequency, nocturia, numbness, tingling, seizures,  Focal weakness, Loss of consciousness,  Tremor, insomnia, depression, anxiety, and suicidal ideation.      Objective:   BP 134/72 mmHg  Pulse 73  Temp(Src) 98.4 F (36.9 C) (Oral)  Resp 16  Ht 5' 2.75" (1.594 m)  Wt 141 lb 4 oz (64.071 kg)  BMI 25.22 kg/m2  SpO2 97%  General appearance: alert, cooperative and appears stated age Head: Normocephalic, without obvious abnormality, atraumatic Eyes: conjunctivae/corneas clear. PERRL, EOM's intact. Fundi benign. Ears: normal TM's and  external ear canals both ears Nose: Nares normal. Septum midline. Mucosa normal. No drainage or sinus tenderness. Throat: lips, mucosa, and tongue normal; teeth and gums normal Neck: no adenopathy, no carotid bruit, no JVD, supple, symmetrical, trachea midline and thyroid not enlarged, symmetric, no tenderness/mass/nodules Lungs: clear to auscultation bilaterally Breasts: normal appearance, no masses or tenderness Heart: regular rate and rhythm, S1, S2 normal, no murmur, click, rub or gallop Abdomen: soft, non-tender; bowel sounds normal; no masses,  no organomegaly Extremities: extremities normal, atraumatic, no cyanosis or edema Pulses: 2+ and symmetric Skin: Skin color, texture, turgor normal. No rashes or lesions Neurologic: Alert and oriented X 3, normal strength and tone. Normal symmetric reflexes. Normal coordination and gait.   Assessment and Plan:   Problem List Items Addressed This Visit    Encounter for wellness examination    Annual wellness  exam was done as well as a comprehensive physical exam and management of acute and chronic conditions .  During the course of the visit the patient was educated and counseled about appropriate screening and preventive services including :  diabetes screening, lipid analysis with projected  10 year  risk for CAD , nutrition counseling, colorectal cancer screening, and recommended immunizations.  Printed recommendations for health maintenance screenings was given.        Menopausal syndrome on hormone replacement therapy    Managed with daily oral estradiol and Paxil as she is s/p TAH/BSO.        Overweight (BMI 25.0-29.9)  I have addressed  BMI and recommended a low glycemic index diet utilizing smaller more frequent meals to increase metabolism.  I have also recommended that patient start exercising with a goal of 30 minutes of aerobic exercise a minimum of 5 days per week. Screening for lipid disorders, thyroid and diabetes to be done  today.        Psoriasis of scalp    requesting refill of steroid lotion which I have done.        Other Visit Diagnoses    Hip pain, chronic, left    -  Primary    Relevant Orders    DG Arthro Hip Left    Screening for osteoporosis        Relevant Orders    DG Bone Density    Screening for breast cancer        Relevant Orders    MM DIGITAL SCREENING BILATERAL    Tingling in extremities        Relevant Orders    Vitamin B12    TSH    Vitamin D deficiency        Relevant Orders    Vit D  25 hydroxy (rtn osteoporosis monitoring)    Hyperlipidemia        Relevant Orders    Comprehensive metabolic panel    Lipid panel

## 2014-12-05 NOTE — Patient Instructions (Signed)

## 2014-12-08 NOTE — Assessment & Plan Note (Signed)
I have addressed  BMI and recommended a low glycemic index diet utilizing smaller more frequent meals to increase metabolism.  I have also recommended that patient start exercising with a goal of 30 minutes of aerobic exercise a minimum of 5 days per week. Screening for lipid disorders, thyroid and diabetes to be done today.   

## 2014-12-08 NOTE — Assessment & Plan Note (Signed)
Managed with daily oral estradiol and Paxil as she is s/p TAH/BSO.

## 2014-12-08 NOTE — Assessment & Plan Note (Signed)
requesting refill of steroid lotion which I have done.

## 2014-12-08 NOTE — Assessment & Plan Note (Signed)

## 2014-12-12 ENCOUNTER — Other Ambulatory Visit (INDEPENDENT_AMBULATORY_CARE_PROVIDER_SITE_OTHER): Payer: 59

## 2014-12-12 DIAGNOSIS — E785 Hyperlipidemia, unspecified: Secondary | ICD-10-CM

## 2014-12-12 DIAGNOSIS — E559 Vitamin D deficiency, unspecified: Secondary | ICD-10-CM

## 2014-12-12 DIAGNOSIS — R202 Paresthesia of skin: Secondary | ICD-10-CM

## 2014-12-12 LAB — COMPREHENSIVE METABOLIC PANEL
ALBUMIN: 4.3 g/dL (ref 3.5–5.2)
ALT: 24 U/L (ref 0–35)
AST: 24 U/L (ref 0–37)
Alkaline Phosphatase: 64 U/L (ref 39–117)
BUN: 21 mg/dL (ref 6–23)
CO2: 31 mEq/L (ref 19–32)
Calcium: 9.5 mg/dL (ref 8.4–10.5)
Chloride: 99 mEq/L (ref 96–112)
Creatinine, Ser: 1.01 mg/dL (ref 0.40–1.20)
GFR: 60.56 mL/min (ref >60.00)
Glucose, Bld: 89 mg/dL (ref 70–99)
POTASSIUM: 4.2 meq/L (ref 3.5–5.1)
Sodium: 136 mEq/L (ref 135–145)
Total Bilirubin: 0.6 mg/dL (ref 0.2–1.2)
Total Protein: 7.4 g/dL (ref 6.0–8.3)

## 2014-12-12 LAB — LIPID PANEL
Cholesterol: 232 mg/dL — ABNORMAL HIGH (ref 0–200)
HDL: 87.1 mg/dL (ref >39.00)
LDL Cholesterol: 120 mg/dL — ABNORMAL HIGH (ref 0–99)
NonHDL: 144.9
TRIGLYCERIDES: 124 mg/dL (ref 0.0–149.0)
Total CHOL/HDL Ratio: 3
VLDL: 24.8 mg/dL (ref 0.0–40.0)

## 2014-12-12 LAB — TSH: TSH: 1.84 u[IU]/mL (ref 0.35–4.50)

## 2014-12-12 LAB — VITAMIN B12: Vitamin B-12: 1006 pg/mL — ABNORMAL HIGH (ref 211–911)

## 2014-12-12 LAB — VITAMIN D 25 HYDROXY (VIT D DEFICIENCY, FRACTURES): VITD: 43.02 ng/mL (ref 30.00–100.00)

## 2014-12-13 ENCOUNTER — Encounter: Payer: Self-pay | Admitting: Internal Medicine

## 2015-01-01 ENCOUNTER — Ambulatory Visit: Payer: Self-pay | Admitting: Internal Medicine

## 2015-01-01 LAB — HM MAMMOGRAPHY: HM Mammogram: NEGATIVE

## 2015-01-29 ENCOUNTER — Other Ambulatory Visit: Payer: Self-pay | Admitting: Internal Medicine

## 2015-08-12 ENCOUNTER — Other Ambulatory Visit: Payer: Self-pay | Admitting: Internal Medicine

## 2015-08-12 DIAGNOSIS — Z79899 Other long term (current) drug therapy: Secondary | ICD-10-CM

## 2015-08-12 NOTE — Telephone Encounter (Signed)
90 day supply authorized and sent , BUT PLEASE SCHEDULE APPT AND LABS NONFASTING OK

## 2015-08-12 NOTE — Telephone Encounter (Signed)
Left a message to call the office and schedule an appointment and non fasting labs.

## 2015-08-12 NOTE — Telephone Encounter (Signed)
Please advise as Last OV was 12/08/14

## 2015-12-12 ENCOUNTER — Encounter: Payer: Self-pay | Admitting: Internal Medicine

## 2015-12-12 ENCOUNTER — Ambulatory Visit (INDEPENDENT_AMBULATORY_CARE_PROVIDER_SITE_OTHER): Payer: 59 | Admitting: Internal Medicine

## 2015-12-12 VITALS — BP 112/86 | HR 61 | Temp 97.9°F | Ht 63.25 in | Wt 156.5 lb

## 2015-12-12 DIAGNOSIS — E785 Hyperlipidemia, unspecified: Secondary | ICD-10-CM | POA: Diagnosis not present

## 2015-12-12 DIAGNOSIS — R635 Abnormal weight gain: Secondary | ICD-10-CM

## 2015-12-12 DIAGNOSIS — Z1239 Encounter for other screening for malignant neoplasm of breast: Secondary | ICD-10-CM

## 2015-12-12 DIAGNOSIS — Z90710 Acquired absence of both cervix and uterus: Secondary | ICD-10-CM | POA: Insufficient documentation

## 2015-12-12 DIAGNOSIS — R5383 Other fatigue: Secondary | ICD-10-CM

## 2015-12-12 DIAGNOSIS — E559 Vitamin D deficiency, unspecified: Secondary | ICD-10-CM | POA: Diagnosis not present

## 2015-12-12 DIAGNOSIS — Z0000 Encounter for general adult medical examination without abnormal findings: Secondary | ICD-10-CM

## 2015-12-12 DIAGNOSIS — Z0189 Encounter for other specified special examinations: Secondary | ICD-10-CM

## 2015-12-12 DIAGNOSIS — K591 Functional diarrhea: Secondary | ICD-10-CM

## 2015-12-12 DIAGNOSIS — Z1159 Encounter for screening for other viral diseases: Secondary | ICD-10-CM

## 2015-12-12 LAB — LIPID PANEL
CHOLESTEROL: 234 mg/dL — AB (ref 0–200)
HDL: 78.9 mg/dL (ref >39.00)
LDL CALC: 130 mg/dL — AB (ref 0–99)
NonHDL: 155.33
TRIGLYCERIDES: 126 mg/dL (ref 0.0–149.0)
Total CHOL/HDL Ratio: 3
VLDL: 25.2 mg/dL (ref 0.0–40.0)

## 2015-12-12 LAB — COMPREHENSIVE METABOLIC PANEL
ALK PHOS: 63 U/L (ref 39–117)
ALT: 17 U/L (ref 0–35)
AST: 19 U/L (ref 0–37)
Albumin: 4.1 g/dL (ref 3.5–5.2)
BILIRUBIN TOTAL: 0.5 mg/dL (ref 0.2–1.2)
BUN: 18 mg/dL (ref 6–23)
CALCIUM: 9.3 mg/dL (ref 8.4–10.5)
CO2: 32 mEq/L (ref 19–32)
Chloride: 101 mEq/L (ref 96–112)
Creatinine, Ser: 0.92 mg/dL (ref 0.40–1.20)
GFR: 67.2 mL/min (ref >60.00)
Glucose, Bld: 91 mg/dL (ref 70–99)
POTASSIUM: 4.3 meq/L (ref 3.5–5.1)
Sodium: 137 mEq/L (ref 135–145)
TOTAL PROTEIN: 7 g/dL (ref 6.0–8.3)

## 2015-12-12 LAB — CBC WITH DIFFERENTIAL/PLATELET
BASOS ABS: 0 10*3/uL (ref 0.0–0.1)
Basophils Relative: 0.7 % (ref 0.0–3.0)
EOS ABS: 0.1 10*3/uL (ref 0.0–0.7)
Eosinophils Relative: 1.8 % (ref 0.0–5.0)
HCT: 42.4 % (ref 36.0–46.0)
HEMOGLOBIN: 14.3 g/dL (ref 12.0–15.0)
Lymphocytes Relative: 28.2 % (ref 12.0–46.0)
Lymphs Abs: 1.3 10*3/uL (ref 0.7–4.0)
MCHC: 33.8 g/dL (ref 30.0–36.0)
MCV: 94.3 fl (ref 78.0–100.0)
MONO ABS: 0.3 10*3/uL (ref 0.1–1.0)
Monocytes Relative: 6.9 % (ref 3.0–12.0)
Neutro Abs: 2.9 10*3/uL (ref 1.4–7.7)
Neutrophils Relative %: 62.4 % (ref 43.0–77.0)
Platelets: 188 10*3/uL (ref 150.0–400.0)
RBC: 4.5 Mil/uL (ref 3.87–5.11)
RDW: 14.1 % (ref 11.5–15.5)
WBC: 4.7 10*3/uL (ref 4.0–10.5)

## 2015-12-12 LAB — VITAMIN D 25 HYDROXY (VIT D DEFICIENCY, FRACTURES): VITD: 25.93 ng/mL — AB (ref 30.00–100.00)

## 2015-12-12 LAB — TSH: TSH: 1.1 u[IU]/mL (ref 0.35–4.50)

## 2015-12-12 MED ORDER — HYDROCORTISONE ACETATE 25 MG RE SUPP: 25.0000 mg | Freq: Two times a day (BID) | RECTAL | Status: DC

## 2015-12-12 NOTE — Progress Notes (Signed)
Pre visit review using our clinic review tool, if applicable. No additional management support is needed unless otherwise documented below in the visit note. 

## 2015-12-12 NOTE — Patient Instructions (Addendum)
The  diet I discussed with you today is the 10 day Green Smoothie Cleansing /Detox Diet by Linden Dolin . available on [place] for around $10.  It does require a blender, (Vita Mix, a electric juicer,  Or a Nutribullet Rx).  This is not a low carb or a weight loss diet,  It is fundamentally a "cleansing" low fat diet that eliminates sugar, gluten, caffeine, alcohol and dairy for 10 days .  What you add back after the initial ten days is entirely up to  you!  You can expect to lose 5 to 10 lbs depending on how strict you are.   I found that  drinking 2 smoothies or juices  daily and keeping one chewable meal (but keep it simple, like baked fish and salad, rice or bok choy) kept me satisfied and kept me from straying  .  You snack primarily on fresh  fruit, egg whites and judicious quantities of nuts.  You can add a  vegetable based protein powder  to any smoothie made with almond milk (nothing with whey , since whey is dairy)  [employer] has a few but  the [employer] has the greatest  selection .  Using frozen fruits is much more convenient and cost effective. You can even find plenty of organic fruit in the frozen fruit section of [employer].  Just thaw what you need for the following day the night before in the refrigerator (to avoid jamming up your machine)   The organic vegan protein powder I tried  is called Vega" and I found it at [employer] .  It is sugar free. Tastes like crap.  My advice:  Sarina Ser your protein  (eat an egg or two in the am with your smoothie or add soy yogurt for protein ) ,  Don't ruin the taste of your smoothies with protein powder unless you can find one you really love.

## 2015-12-14 DIAGNOSIS — R197 Diarrhea, unspecified: Secondary | ICD-10-CM | POA: Insufficient documentation

## 2015-12-14 NOTE — Progress Notes (Signed)
Patient ID: [name], female    DOB: 06-Sep-1961  Age: 55 y.o. MRN: [id number]  The patient is here for annual wellness examination and management of other chronic and acute problems.   Normal mammogram march 2016 S/p abd hysterectomy Colonoscopy 2014 repeat 2019 due to HF colon polyps ([place])  The risk factors are reflected in the social history.  The roster of all physicians providing medical care to patient - is listed in the Snapshot section of the chart.  Activities of daily living:  The patient is 100% independent in all ADLs: dressing, toileting, feeding as well as independent mobility  Home safety : The patient has smoke detectors in the home. They wear seatbelts.  There are no firearms at home. There is no violence in the home.   There is no risks for hepatitis, STDs or HIV. There is no   history of blood transfusion. They have no travel history to infectious disease endemic areas of the world.  The patient has seen their dentist in the last six month. They have seen their eye doctor in the last year. They admit to slight hearing difficulty with regard to whispered voices and some television programs.  They have deferred audiologic testing in the last year.  They do not  have excessive sun exposure. Discussed the need for sun protection: hats, long sleeves and use of sunscreen if there is significant sun exposure.   Diet: the importance of a healthy diet is discussed. They do have a healthy diet.  The benefits of regular aerobic exercise were discussed. She walks 4 times per week ,  20 minutes.   Depression screen: there are no signs or vegative symptoms of depression- irritability, change in appetite, anhedonia, sadness/tearfullness.  Cognitive assessment: the patient manages all their financial and personal affairs and is actively engaged. They could relate day,date,year and events; recalled 2/3 objects at 3 minutes; performed clock-face test normally.  The following portions  of the patient's history were reviewed and updated as appropriate: allergies, current medications, past family history, past medical history,  past surgical history, past social history  and problem list.  Visual acuity was not assessed per patient preference since she has regular follow up with her ophthalmologist. Hearing and body mass index were assessed and reviewed.   During the course of the visit the patient was educated and counseled about appropriate screening and preventive services including : fall prevention , diabetes screening, nutrition counseling, colorectal cancer screening, and recommended immunizations.    CC: The primary encounter diagnosis was S/P abdominal hysterectomy. Diagnoses of Other fatigue, Weight gain, Hyperlipidemia, Need for hepatitis C screening test, Vitamin D deficiency, Screening for breast cancer, Encounter for wellness examination, and Functional diarrhea were also pertinent to this visit.  15 lb weight gain since Feb 16 Hemorrhoids have been bleeding.  Taking 1200 mg magnesium daily . Having Some rectal leakage.  Drinking water kefir    History Canary has a past medical history of Post-menopause; Arthritis; Cervical spine arthritis ([place]); Irritable bowel syndrome (IBS); and Lipoma of thigh.   She has past surgical history that includes Nephrectomy living donor (2011) and Abdominal hysterectomy.   Her family history includes Cancer in her paternal aunt and sister; Cancer (age of onset: 59) in her sister; Cancer (age of onset: 97) in her sister; Hearing loss in her daughter; Mental illness (age of onset: 93) in her father.She reports that she has never smoked. She has never used smokeless tobacco. She reports that she drinks  alcohol. She reports that she does not use illicit drugs.  Outpatient Prescriptions Prior to Visit  Medication Sig Dispense Refill  . estradiol (ESTRACE) 1 MG tablet TAKE 1 TABLET BY MOUTH DAILY. 90 tablet 1  . Fluocinolone Acetonide  0.01 % OIL Apply daily to affected area as needed 20 mL 0  . ibuprofen (ADVIL,MOTRIN) 200 MG tablet Take 200 mg by mouth every 6 (six) hours as needed.    . magnesium oxide (MAG-OX) 400 MG tablet Take 400 mg by mouth 2 (two) times daily.    Marland Kitchen PARoxetine (PAXIL) 20 MG tablet Take 1 tablet (20 mg total) by mouth every morning. 90 tablet 1  . Potassium 99 MG TABS Take by mouth 2 (two) times daily.    Marland Kitchen pyridOXINE (VITAMIN B-6) 100 MG tablet Take 100 mg by mouth daily. Reported on 12/12/2015     No facility-administered medications prior to visit.    Review of Systems  Objective:  BP 112/86 mmHg  Pulse 61  Temp(Src) 97.9 F (36.6 C) (Oral)  Ht 5' 3.25" (1.607 m)  Wt 156 lb 8 oz (70.988 kg)  BMI 27.49 kg/m2  SpO2 97%  Physical Exam    Assessment & Plan:   Problem List Items Addressed This Visit    Encounter for wellness examination    Annual comprehensive preventive exam was done as well as an evaluation and management of chronic conditions .  During the course of the visit the patient was educated and counseled about appropriate screening and preventive services including :  diabetes screening, lipid analysis with projected  10 year  risk for CAD , nutrition counseling, breast, cervical and colorectal cancer screening, and recommended immunizations.  Printed recommendations for health maintenance screenings was give      Diarrhea    Likely due to excessive amounts of magnesium.  Advised to reduce to 400 mg .  Steroid suppositories prescribed for hemorrhoidal bleeding.       S/P abdominal hysterectomy - Primary    Other Visit Diagnoses    Other fatigue        Relevant Orders    Comprehensive metabolic panel (Completed)    TSH (Completed)    CBC with Differential/Platelet (Completed)    Weight gain        Relevant Orders    Comprehensive metabolic panel (Completed)    Lipid panel (Completed)    Hyperlipidemia        Need for hepatitis C screening test        Relevant Orders     Hepatitis C antibody    Vitamin D deficiency        Relevant Orders    VITAMIN D 25 Hydroxy (Vit-D Deficiency, Fractures) (Completed)    Screening for breast cancer        Relevant Orders    MM DIGITAL SCREENING BILATERAL       I am having Ms. Lobdell start on hydrocortisone. I am also having her maintain her magnesium oxide, Potassium, ibuprofen, pyridOXINE, Fluocinolone Acetonide, estradiol, and PARoxetine.  Meds ordered this encounter  Medications  . hydrocortisone (ANUSOL-HC) 25 MG suppository    Sig: Place 1 suppository (25 mg total) rectally 2 (two) times daily.    Dispense:  12 suppository    Refill:  3    There are no discontinued medications.  Follow-up: No Follow-up on file.   Crecencio Mc, MD

## 2015-12-14 NOTE — Assessment & Plan Note (Signed)
Annual comprehensive preventive exam was done as well as an evaluation and management of chronic conditions .  During the course of the visit the patient was educated and counseled about appropriate screening and preventive services including :  diabetes screening, lipid analysis with projected  10 year  risk for CAD , nutrition counseling, breast, cervical and colorectal cancer screening, and recommended immunizations.  Printed recommendations for health maintenance screenings was give 

## 2015-12-14 NOTE — Assessment & Plan Note (Signed)
Likely due to excessive amounts of magnesium.  Advised to reduce to 400 mg .  Steroid suppositories prescribed for hemorrhoidal bleeding.

## 2015-12-15 ENCOUNTER — Encounter: Payer: Self-pay | Admitting: Internal Medicine

## 2015-12-31 NOTE — Telephone Encounter (Signed)
Mailed unread message to patient.  

## 2016-02-18 ENCOUNTER — Other Ambulatory Visit: Payer: Self-pay | Admitting: Internal Medicine

## 2016-02-18 ENCOUNTER — Encounter: Payer: Self-pay | Admitting: Internal Medicine

## 2016-02-18 MED ORDER — ESTRADIOL 0.1 MG/24HR TD PTTW: 1.0000 | MEDICATED_PATCH | TRANSDERMAL | Status: DC

## 2016-02-18 MED ORDER — HYDROCORTISONE ACETATE 25 MG RE SUPP: 25.0000 mg | Freq: Two times a day (BID) | RECTAL | Status: DC

## 2016-04-09 DIAGNOSIS — H524 Presbyopia: Secondary | ICD-10-CM | POA: Diagnosis not present

## 2016-09-23 ENCOUNTER — Other Ambulatory Visit: Payer: Self-pay | Admitting: Internal Medicine

## 2017-02-25 ENCOUNTER — Encounter: Payer: Self-pay | Admitting: Internal Medicine

## 2017-02-25 DIAGNOSIS — K649 Unspecified hemorrhoids: Secondary | ICD-10-CM

## 2017-02-25 NOTE — Telephone Encounter (Signed)
I do not mind placing the referral in Dr Lupita Dawn absence, just need a little more information.  See if she has a Nurse, children's she prefers to see at [facility].  Also, make sure she is ok with waiting for surgery appt.  If any acute issues, will need to be seen earlier.

## 2017-03-01 NOTE — Telephone Encounter (Signed)
Your referral is in process as requested to dr Marcello Moores at [facility] [place] [facility] . Our referral coordinator will call you when the appointment has been made.

## 2017-03-19 ENCOUNTER — Other Ambulatory Visit: Payer: Self-pay | Admitting: Internal Medicine

## 2017-03-19 NOTE — Telephone Encounter (Signed)
No labs since 12/12/15 and no OV since 12/12/15 ?

## 2017-03-22 NOTE — Telephone Encounter (Signed)
Refill for 30 days only.  OFFICE VISIT NEEDED prior to any more refills 

## 2017-03-31 ENCOUNTER — Other Ambulatory Visit: Payer: Self-pay | Admitting: Internal Medicine

## 2017-03-31 NOTE — Telephone Encounter (Signed)
Anusol-HC 25 mg Last OV: 12/12/2015 Last Refilled 02/18/2016 No New appt Referral to General Surgery 04/13/2017 at 11 am Please advise

## 2017-04-02 ENCOUNTER — Encounter: Payer: Self-pay | Admitting: *Deleted

## 2017-04-02 NOTE — Telephone Encounter (Signed)
Mychart message sent.

## 2017-04-26 ENCOUNTER — Other Ambulatory Visit: Payer: Self-pay | Admitting: General Surgery

## 2017-04-26 DIAGNOSIS — K642 Third degree hemorrhoids: Secondary | ICD-10-CM | POA: Diagnosis not present

## 2017-04-26 NOTE — H&P (Signed)
History of Present Illness Helen Ruff MD; 7/42/5956 10:51 AM) The patient is a 56 year old female who presents with hemorrhoids. 56 year old female who presents to the office for evaluation of her hemorrhoid disease. She states that she has always had trouble with hemorrhoids and constipation. She is also noted some rectal bleeding on the toilet paper. She is up-to-date with her colonoscopies and has had a recent colonoscopy that was negative. She has resolved most of her constipation symptoms with suppositories and magnesium on a daily basis. She denies having to strain currently to have a bowel movement. She reports itching and burning as well as mucus drainage.   Past Surgical History [name], [place]; 04/26/2017 10:37 AM) Hysterectomy (not due to cancer) - Complete Nephrectomy Left. Oral Surgery Tonsillectomy  Diagnostic Studies History [name], [place]; 04/26/2017 10:37 AM) Colonoscopy 1-5 years ago Mammogram within last year Pap Smear >5 years ago  Allergies [name], RMA; 04/26/2017 10:38 AM) No Known Allergies 04/26/2017  Medication History [name], RMA; 04/26/2017 10:40 AM) Anusol-HC (25MG  Suppository, Rectal) Active. Estradiol (0.1MG /24HR Patch TW, Transdermal) Active. Ibuprofen (200MG  Tablet, Oral) Active. Magnesium Oxide (400MG  Tablet, Oral) Active. PARoxetine HCl (20MG  Tablet, Oral) Active. Potassium (99MG  Tablet, Oral) Active. Colace (Oral) Specific strength unknown - Active. Medications Reconciled  Social History [name], [place]; 04/26/2017 10:37 AM) Alcohol use Occasional alcohol use. Caffeine use Carbonated beverages, Coffee, Tea. No drug use Tobacco use Never smoker.  Family History [name], [place]; 04/26/2017 10:37 AM) Arthritis Family Members In General. Cancer Sister. Depression Family Members In General, Father. Melanoma Sister. Thyroid problems Sister.  Pregnancy / Birth  History [name], [place]; 04/26/2017 10:37 AM) Age at menarche 4 years. Age of menopause [age] Contraceptive History Depo-provera, Oral contraceptives. Gravida 3 Length (months) of breastfeeding 3-6 Maternal age 37-35 Para 2  Other Problems [name], [place]; 04/26/2017 10:37 AM) Back Pain Oophorectomy     Review of Systems [name] RMA; 04/26/2017 10:37 AM) General Not Present- Appetite Loss, Chills, Fatigue, Fever, Night Sweats, Weight Gain and Weight Loss. Skin Not Present- Change in Wart/Mole, Dryness, Hives, Jaundice, New Lesions, Non-Healing Wounds, Rash and Ulcer. HEENT Present- Wears glasses/contact lenses. Not Present- Earache, Hearing Loss, Hoarseness, Nose Bleed, Oral Ulcers, Ringing in the Ears, Seasonal Allergies, Sinus Pain, Sore Throat, Visual Disturbances and Yellow Eyes. Respiratory Not Present- Bloody sputum, Chronic Cough, Difficulty Breathing, Snoring and Wheezing. Breast Not Present- Breast Mass, Breast Pain, Nipple Discharge and Skin Changes. Cardiovascular Present- Palpitations. Not Present- Chest Pain, Difficulty Breathing Lying Down, Leg Cramps, Rapid Heart Rate, Shortness of Breath and Swelling of Extremities. Gastrointestinal Present- Bloody Stool, Hemorrhoids and Rectal Pain. Not Present- Abdominal Pain, Bloating, Change in Bowel Habits, Chronic diarrhea, Constipation, Difficulty Swallowing, Excessive gas, Gets full quickly at meals, Indigestion, Nausea and Vomiting. Female Genitourinary Not Present- Frequency, Nocturia, Painful Urination, Pelvic Pain and Urgency. Musculoskeletal Present- Joint Stiffness. Not Present- Back Pain, Joint Pain, Muscle Pain, Muscle Weakness and Swelling of Extremities. Neurological Not Present- Decreased Memory, Fainting, Headaches, Numbness, Seizures, Tingling, Tremor, Trouble walking and Weakness. Psychiatric Not Present- Anxiety, Bipolar, Change in Sleep Pattern, Depression, Fearful and Frequent  crying. Endocrine Present- Hot flashes. Not Present- Cold Intolerance, Excessive Hunger, Hair Changes, Heat Intolerance and New Diabetes. Hematology Not Present- Blood Thinners, Easy Bruising, Excessive bleeding, Gland problems, HIV and Persistent Infections.  Vitals [name] RMA; 04/26/2017 10:40 AM) 04/26/2017 10:40 AM Weight: 153.4 lb Height: 62in Body Surface Area: 1.71 m Body Mass Index: 28.06 kg/m  Temp.: 97.60F  Pulse: 72 (Regular)  BP: 140/90 (Sitting, Left Arm, Standard)      Physical Exam Helen Ruff MD; 12/04/3341 11:02 AM)  General Mental Status-Alert. General Appearance-Not in acute distress. Build & Nutrition-Well nourished. Posture-Normal posture. Gait-Normal.  Head and Neck Head-normocephalic, atraumatic with no lesions or palpable masses. Trachea-midline.  Chest and Lung Exam Chest and lung exam reveals -on auscultation, normal breath sounds, no adventitious sounds and normal vocal resonance.  Cardiovascular Cardiovascular examination reveals -normal heart sounds, regular rate and rhythm with no murmurs and no digital clubbing, cyanosis, edema, increased warmth or tenderness.  Abdomen Inspection Inspection of the abdomen reveals - No Hernias. Palpation/Percussion Palpation and Percussion of the abdomen reveal - Soft, Non Tender, No Rigidity (guarding), No hepatosplenomegaly and No Palpable abdominal masses.  Rectal Anorectal Exam Internal - normal sphincter tone. Note: Grade 3 prolapsing hemorrhoid  Neurologic Neurologic evaluation reveals -alert and oriented x 3 with no impairment of recent or remote memory, normal attention span and ability to concentrate, normal sensation and normal coordination.  Musculoskeletal Normal Exam - Bilateral-Upper Extremity Strength Normal and Lower Extremity Strength Normal.   Results Helen Ruff MD; 5/68/6168 11:09 AM) Procedures  Name Value Date ANOSCOPY,  DIAGNOSTIC (37290) [ Hemorrhoids ] Procedure Other: Procedure: Anoscopy Surgeon: Marcello Moores After the risks and benefits were explained, verbal consent was obtained for above procedure. A medical assistant chaperone was present thoroughout the entire procedure. Anesthesia: none Diagnosis: rectal bleeding Findings: Grade 3 right anterior internal hemorrhoid with signs of chronic inflammation, grade 1 right posterior internal hemorrhoid with signs of inflammation , grade 1 left lateral with no inflammation  Performed: 04/26/2017 11:08 AM    Assessment & Plan Helen Ruff MD; 12/13/1550 11:08 AM)  PROLAPSED INTERNAL HEMORRHOIDS, GRADE 3 (C80.2) Impression: 56 year old female with a history of constipation who presents to the office with a grade 3 right anterior internal and external hemorrhoid. There is signs of chronic inflammation. I have recommended hemorrhoidectomy. She also has a grade 1 hemorrhoid in the right anterior position. We will perform a hemorrhoidal pexy at this location at the same time. We have discussed the typical time away from work and pain management regimens. Risks include bleeding and recurrence. All questions were answered.

## 2017-05-18 ENCOUNTER — Other Ambulatory Visit: Payer: Self-pay | Admitting: Internal Medicine

## 2017-05-18 NOTE — Telephone Encounter (Signed)
Refilled: 03/22/2017 Last OV: 12/12/2015 Next OV: not scheduled

## 2017-05-20 ENCOUNTER — Encounter (HOSPITAL_BASED_OUTPATIENT_CLINIC_OR_DEPARTMENT_OTHER): Payer: Self-pay | Admitting: *Deleted

## 2017-05-20 NOTE — Progress Notes (Signed)
NPO AFTER MN.  ARRIVE AT 0915.  NEEDS HG.

## 2017-06-03 NOTE — Progress Notes (Signed)
PT CASE TIME HAD BEEN MOVED FROM 1115 TO 1030.  PT CALLED BY VICKI, OFFICE STAFF, VIA PHONE.  PT VERBALIZED UNDERSTANDING TO ARRIVE AT 0830 INSTEAD OF 0915.

## 2017-06-04 ENCOUNTER — Encounter (HOSPITAL_BASED_OUTPATIENT_CLINIC_OR_DEPARTMENT_OTHER)
Admission: RE | Disposition: A | Discharge status: Home / Self Care | Payer: Self-pay | Source: Ambulatory Visit | Attending: General Surgery

## 2017-06-04 ENCOUNTER — Encounter (HOSPITAL_BASED_OUTPATIENT_CLINIC_OR_DEPARTMENT_OTHER): Payer: Self-pay | Admitting: *Deleted

## 2017-06-04 ENCOUNTER — Ambulatory Visit (HOSPITAL_BASED_OUTPATIENT_CLINIC_OR_DEPARTMENT_OTHER)
Admission: RE | Admit: 2017-06-04 | Discharge: 2017-06-04 | Disposition: A | Discharge status: Home / Self Care | Payer: 59 | Source: Ambulatory Visit | Attending: General Surgery | Admitting: General Surgery

## 2017-06-04 ENCOUNTER — Ambulatory Visit (HOSPITAL_BASED_OUTPATIENT_CLINIC_OR_DEPARTMENT_OTHER): Payer: 59 | Admitting: Anesthesiology

## 2017-06-04 DIAGNOSIS — Z905 Acquired absence of kidney: Secondary | ICD-10-CM | POA: Diagnosis not present

## 2017-06-04 DIAGNOSIS — K5900 Constipation, unspecified: Secondary | ICD-10-CM | POA: Insufficient documentation

## 2017-06-04 DIAGNOSIS — K642 Third degree hemorrhoids: Secondary | ICD-10-CM | POA: Diagnosis not present

## 2017-06-04 DIAGNOSIS — K648 Other hemorrhoids: Secondary | ICD-10-CM | POA: Diagnosis not present

## 2017-06-04 DIAGNOSIS — K641 Second degree hemorrhoids: Secondary | ICD-10-CM | POA: Diagnosis not present

## 2017-06-04 DIAGNOSIS — K644 Residual hemorrhoidal skin tags: Secondary | ICD-10-CM | POA: Diagnosis not present

## 2017-06-04 DIAGNOSIS — Z90710 Acquired absence of both cervix and uterus: Secondary | ICD-10-CM | POA: Insufficient documentation

## 2017-06-04 DIAGNOSIS — K640 First degree hemorrhoids: Secondary | ICD-10-CM | POA: Diagnosis not present

## 2017-06-04 DIAGNOSIS — D1720 Benign lipomatous neoplasm of skin and subcutaneous tissue of unspecified limb: Secondary | ICD-10-CM | POA: Diagnosis not present

## 2017-06-04 HISTORY — DX: Personal history of colon polyps: Z86.010

## 2017-06-04 HISTORY — DX: Trochanteric bursitis, left hip: M70.62

## 2017-06-04 HISTORY — DX: Other cervical disc degeneration, unspecified cervical region: M50.30

## 2017-06-04 HISTORY — DX: Vitamin D deficiency, unspecified: E55.9

## 2017-06-04 HISTORY — DX: Personal history of other specified conditions: Z87.898

## 2017-06-04 HISTORY — DX: Other hemorrhoids: K64.8

## 2017-06-04 HISTORY — DX: Presence of spectacles and contact lenses: Z97.3

## 2017-06-04 HISTORY — DX: Personal history of other diseases of the female genital tract: Z87.42

## 2017-06-04 HISTORY — DX: Personal history of colon polyps, unspecified: Z86.0100

## 2017-06-04 HISTORY — PX: HEMORRHOID SURGERY: SHX153

## 2017-06-04 HISTORY — DX: Nausea with vomiting, unspecified: Z98.890

## 2017-06-04 HISTORY — DX: Nausea with vomiting, unspecified: R11.2

## 2017-06-04 HISTORY — DX: Personal history of other benign neoplasm: Z86.018

## 2017-06-04 HISTORY — DX: Unspecified osteoarthritis, unspecified site: M19.90

## 2017-06-04 HISTORY — DX: Renal agenesis, unilateral: Q60.0

## 2017-06-04 LAB — POCT HEMOGLOBIN-HEMACUE: Hemoglobin: 14.7 g/dL (ref 12.0–15.0)

## 2017-06-04 SURGERY — HEMORRHOIDECTOMY
Anesthesia: Monitor Anesthesia Care

## 2017-06-04 MED ORDER — FENTANYL CITRATE (PF) 100 MCG/2ML IJ SOLN
INTRAMUSCULAR | Status: AC
  Filled 2017-06-04: qty 2

## 2017-06-04 MED ORDER — LIDOCAINE 2% (20 MG/ML) 5 ML SYRINGE
INTRAMUSCULAR | Status: DC | PRN
  Administered 2017-06-04: 100 mg via INTRAVENOUS

## 2017-06-04 MED ORDER — OXYCODONE HCL 5 MG PO TABS
5.0000 mg | ORAL_TABLET | ORAL | Status: DC | PRN
  Filled 2017-06-04: qty 2

## 2017-06-04 MED ORDER — ONDANSETRON HCL 4 MG/2ML IJ SOLN
4.0000 mg | Freq: Four times a day (QID) | INTRAMUSCULAR | Status: DC | PRN
  Filled 2017-06-04: qty 2

## 2017-06-04 MED ORDER — ONDANSETRON HCL 4 MG/2ML IJ SOLN
INTRAMUSCULAR | Status: DC | PRN
  Administered 2017-06-04: 4 mg via INTRAVENOUS

## 2017-06-04 MED ORDER — CELECOXIB 200 MG PO CAPS
ORAL_CAPSULE | ORAL | Status: AC
  Filled 2017-06-04: qty 2

## 2017-06-04 MED ORDER — GABAPENTIN 300 MG PO CAPS
ORAL_CAPSULE | ORAL | Status: AC
  Filled 2017-06-04: qty 1

## 2017-06-04 MED ORDER — SODIUM CHLORIDE 0.9% FLUSH
3.0000 mL | Freq: Two times a day (BID) | INTRAVENOUS | Status: DC
  Filled 2017-06-04: qty 3

## 2017-06-04 MED ORDER — ACETAMINOPHEN 650 MG RE SUPP
650.0000 mg | RECTAL | Status: DC | PRN
  Filled 2017-06-04: qty 1

## 2017-06-04 MED ORDER — OXYCODONE HCL 5 MG PO TABS
5.0000 mg | ORAL_TABLET | Freq: Once | ORAL | Status: DC | PRN
  Filled 2017-06-04: qty 1

## 2017-06-04 MED ORDER — SODIUM CHLORIDE 0.9% FLUSH
3.0000 mL | INTRAVENOUS | Status: DC | PRN
  Filled 2017-06-04: qty 3

## 2017-06-04 MED ORDER — FENTANYL CITRATE (PF) 100 MCG/2ML IJ SOLN
INTRAMUSCULAR | Status: DC | PRN
  Administered 2017-06-04: 50 ug via INTRAVENOUS

## 2017-06-04 MED ORDER — ACETAMINOPHEN 500 MG PO TABS
ORAL_TABLET | ORAL | Status: AC
  Filled 2017-06-04: qty 2

## 2017-06-04 MED ORDER — PROPOFOL 500 MG/50ML IV EMUL
INTRAVENOUS | Status: DC | PRN
  Administered 2017-06-04: 50 ug/kg/min via INTRAVENOUS

## 2017-06-04 MED ORDER — ONDANSETRON HCL 4 MG/2ML IJ SOLN
INTRAMUSCULAR | Status: AC
  Filled 2017-06-04: qty 2

## 2017-06-04 MED ORDER — BUPIVACAINE-EPINEPHRINE 0.5% -1:200000 IJ SOLN
INTRAMUSCULAR | Status: DC | PRN
  Administered 2017-06-04: 20 mL

## 2017-06-04 MED ORDER — DEXAMETHASONE SODIUM PHOSPHATE 10 MG/ML IJ SOLN
INTRAMUSCULAR | Status: AC
  Filled 2017-06-04: qty 1

## 2017-06-04 MED ORDER — LIDOCAINE 2% (20 MG/ML) 5 ML SYRINGE
INTRAMUSCULAR | Status: AC
  Filled 2017-06-04: qty 5

## 2017-06-04 MED ORDER — GABAPENTIN 300 MG PO CAPS
300.0000 mg | ORAL_CAPSULE | ORAL | Status: AC
  Administered 2017-06-04: 300 mg via ORAL
  Filled 2017-06-04: qty 1

## 2017-06-04 MED ORDER — SODIUM CHLORIDE 0.9 % IV SOLN
250.0000 mL | INTRAVENOUS | Status: DC | PRN
  Filled 2017-06-04: qty 250

## 2017-06-04 MED ORDER — ACETAMINOPHEN 500 MG PO TABS
1000.0000 mg | ORAL_TABLET | ORAL | Status: AC
  Administered 2017-06-04: 1000 mg via ORAL
  Filled 2017-06-04: qty 2

## 2017-06-04 MED ORDER — LACTATED RINGERS IV SOLN
INTRAVENOUS | Status: DC
  Administered 2017-06-04: 09:00:00 via INTRAVENOUS
  Administered 2017-06-04: 1000 mL via INTRAVENOUS
  Filled 2017-06-04: qty 1000

## 2017-06-04 MED ORDER — FENTANYL CITRATE (PF) 100 MCG/2ML IJ SOLN
25.0000 ug | INTRAMUSCULAR | Status: DC | PRN
  Filled 2017-06-04: qty 1

## 2017-06-04 MED ORDER — OXYCODONE HCL 5 MG PO TABS: 5.0000 mg | ORAL_TABLET | ORAL | 0 refills | Status: DC | PRN

## 2017-06-04 MED ORDER — PROPOFOL 10 MG/ML IV BOLUS
INTRAVENOUS | Status: DC | PRN
  Administered 2017-06-04: 30 mg via INTRAVENOUS

## 2017-06-04 MED ORDER — MIDAZOLAM HCL 5 MG/5ML IJ SOLN
INTRAMUSCULAR | Status: DC | PRN
  Administered 2017-06-04: 2 mg via INTRAVENOUS

## 2017-06-04 MED ORDER — PROPOFOL 500 MG/50ML IV EMUL
INTRAVENOUS | Status: AC
  Filled 2017-06-04: qty 50

## 2017-06-04 MED ORDER — ACETAMINOPHEN 325 MG PO TABS
650.0000 mg | ORAL_TABLET | ORAL | Status: DC | PRN
  Filled 2017-06-04: qty 2

## 2017-06-04 MED ORDER — OXYCODONE HCL 5 MG/5ML PO SOLN
5.0000 mg | Freq: Once | ORAL | Status: DC | PRN
  Filled 2017-06-04: qty 5

## 2017-06-04 MED ORDER — CELECOXIB 400 MG PO CAPS
400.0000 mg | ORAL_CAPSULE | ORAL | Status: AC
  Administered 2017-06-04: 400 mg via ORAL
  Filled 2017-06-04: qty 1

## 2017-06-04 MED ORDER — MIDAZOLAM HCL 2 MG/2ML IJ SOLN
INTRAMUSCULAR | Status: AC
  Filled 2017-06-04: qty 2

## 2017-06-04 MED ORDER — BUPIVACAINE LIPOSOME 1.3 % IJ SUSP
INTRAMUSCULAR | Status: DC | PRN
  Administered 2017-06-04: 20 mL

## 2017-06-04 MED FILL — oxyCODONE HCL 5 MG TABS: 5 | 3 days supply | Qty: 30 | Fill #0

## 2017-06-04 SURGICAL SUPPLY — 39 items
BLADE HEX COATED 2.75 (ELECTRODE) ×3 IMPLANT
BLADE SURG 10 STRL SS (BLADE) ×3 IMPLANT
BRIEF STRETCH FOR OB PAD LRG (UNDERPADS AND DIAPERS) ×2 IMPLANT
COVER BACK TABLE 60X90IN (DRAPES) ×3 IMPLANT
COVER MAYO STAND STRL (DRAPES) ×3 IMPLANT
DRAPE LAPAROTOMY 100X72 PEDS (DRAPES) ×3 IMPLANT
DRAPE UTILITY XL STRL (DRAPES) ×3 IMPLANT
ELECT BLADE 6.5 .24CM SHAFT (ELECTRODE) IMPLANT
ELECT REM PT RETURN 9FT ADLT (ELECTROSURGICAL) ×3
ELECTRODE REM PT RTRN 9FT ADLT (ELECTROSURGICAL) ×1 IMPLANT
GAUZE SPONGE 4X4 16PLY XRAY LF (GAUZE/BANDAGES/DRESSINGS) IMPLANT
GLOVE BIO SURGEON STRL SZ 6.5 (GLOVE) ×2 IMPLANT
GLOVE BIO SURGEONS STRL SZ 6.5 (GLOVE) ×1
GLOVE INDICATOR 7.0 STRL GRN (GLOVE) ×3 IMPLANT
GOWN SPEC L3 XXLG W/TWL (GOWN DISPOSABLE) ×6 IMPLANT
KIT RM TURNOVER CYSTO AR (KITS) ×3 IMPLANT
NEEDLE HYPO 22GX1.5 SAFETY (NEEDLE) ×3 IMPLANT
NS IRRIG 500ML POUR BTL (IV SOLUTION) ×3 IMPLANT
PACK BASIN DAY SURGERY FS (CUSTOM PROCEDURE TRAY) ×3 IMPLANT
PAD ABD 8X10 STRL (GAUZE/BANDAGES/DRESSINGS) ×3 IMPLANT
PAD ARMBOARD 7.5X6 YLW CONV (MISCELLANEOUS) ×2 IMPLANT
PENCIL BUTTON HOLSTER BLD 10FT (ELECTRODE) ×3 IMPLANT
SPONGE GAUZE 4X4 12PLY (GAUZE/BANDAGES/DRESSINGS) ×3 IMPLANT
SPONGE SURGIFOAM ABS GEL 100 (HEMOSTASIS) IMPLANT
SPONGE SURGIFOAM ABS GEL 12-7 (HEMOSTASIS) IMPLANT
SUT CHROMIC 2 0 SH (SUTURE) ×4 IMPLANT
SUT CHROMIC 3 0 SH 27 (SUTURE) ×2 IMPLANT
SUT PROLENE 2 0 BLUE (SUTURE) IMPLANT
SUT VIC AB 2-0 SH 27 (SUTURE)
SUT VIC AB 2-0 SH 27XBRD (SUTURE) IMPLANT
SUT VIC AB 4-0 P-3 18XBRD (SUTURE) IMPLANT
SUT VIC AB 4-0 P3 18 (SUTURE)
SUT VIC AB 4-0 SH 18 (SUTURE) IMPLANT
SYR CONTROL 10ML LL (SYRINGE) ×3 IMPLANT
TRAY DSU PREP LF (CUSTOM PROCEDURE TRAY) ×3 IMPLANT
TUBE CONNECTING 12'X1/4 (SUCTIONS) ×1
TUBE CONNECTING 12X1/4 (SUCTIONS) ×2 IMPLANT
WATER STERILE IRR 500ML POUR (IV SOLUTION) IMPLANT
YANKAUER SUCT BULB TIP NO VENT (SUCTIONS) ×3 IMPLANT

## 2017-06-04 NOTE — Progress Notes (Signed)
This Rn checked patient bandage to see of there was anymore bleeding noted from surgical site. No new drainage noted.

## 2017-06-04 NOTE — Anesthesia Procedure Notes (Signed)
Procedure Name: MAC Date/Time: 06/04/2017 10:50 AM Performed by: Bethena Roys T Pre-anesthesia Checklist: Patient identified, Timeout performed, Emergency Drugs available, Suction available and Patient being monitored Patient Re-evaluated:Patient Re-evaluated prior to induction Oxygen Delivery Method: Nasal cannula Placement Confirmation: positive ETCO2

## 2017-06-04 NOTE — H&P (Signed)
The patient is a 56 year old female who presents with hemorrhoids. 56 year old female who presents to the office for evaluation of her hemorrhoid disease. She states that she has always had trouble with hemorrhoids and constipation. She is also noted some rectal bleeding on the toilet paper. She is up-to-date with her colonoscopies and has had a recent colonoscopy that was negative. She has resolved most of her constipation symptoms with suppositories and magnesium on a daily basis. She denies having to strain currently to have a bowel movement. She reports itching and burning as well as mucus drainage.   Past Surgical History [name], [place]; 04/26/2017 10:37 AM) Hysterectomy (not due to cancer) - Complete Nephrectomy Left. Oral Surgery Tonsillectomy  Diagnostic Studies History [name], [place]; 04/26/2017 10:37 AM) Colonoscopy 1-5 years ago Mammogram within last year Pap Smear >5 years ago  Allergies [name], RMA; 04/26/2017 10:38 AM) No Known Allergies 04/26/2017  Medication History [name], RMA; 04/26/2017 10:40 AM) Anusol-HC (25MG  Suppository, Rectal) Active. Estradiol (0.1MG /24HR Patch TW, Transdermal) Active. Ibuprofen (200MG  Tablet, Oral) Active. Magnesium Oxide (400MG  Tablet, Oral) Active. PARoxetine HCl (20MG  Tablet, Oral) Active. Potassium (99MG  Tablet, Oral) Active. Colace (Oral) Specific strength unknown - Active. Medications Reconciled  Social History [name], [place]; 04/26/2017 10:37 AM) Alcohol use Occasional alcohol use. Caffeine use Carbonated beverages, Coffee, Tea. No drug use Tobacco use Never smoker.  Family History [name], [place]; 04/26/2017 10:37 AM) Arthritis Family Members In General. Cancer Sister. Depression Family Members In General, Father. Melanoma Sister. Thyroid problems Sister.  Pregnancy / Birth History [name], [place]; 04/26/2017 10:37 AM) Age at  menarche 45 years. Age of menopause 61-50 Contraceptive History Depo-provera, Oral contraceptives. Gravida 3 Length (months) of breastfeeding 3-6 Maternal age 40-35 Para 2  Other Problems [name], [place]; 04/26/2017 10:37 AM) Back Pain Oophorectomy     Review of Systems General Not Present- Appetite Loss, Chills, Fatigue, Fever, Night Sweats, Weight Gain and Weight Loss. Skin Not Present- Change in Wart/Mole, Dryness, Hives, Jaundice, New Lesions, Non-Healing Wounds, Rash and Ulcer. HEENT Present- Wears glasses/contact lenses. Not Present- Earache, Hearing Loss, Hoarseness, Nose Bleed, Oral Ulcers, Ringing in the Ears, Seasonal Allergies, Sinus Pain, Sore Throat, Visual Disturbances and Yellow Eyes. Respiratory Not Present- Bloody sputum, Chronic Cough, Difficulty Breathing, Snoring and Wheezing. Breast Not Present- Breast Mass, Breast Pain, Nipple Discharge and Skin Changes. Cardiovascular Present- Palpitations. Not Present- Chest Pain, Difficulty Breathing Lying Down, Leg Cramps, Rapid Heart Rate, Shortness of Breath and Swelling of Extremities. Gastrointestinal Present- Bloody Stool, Hemorrhoids and Rectal Pain. Not Present- Abdominal Pain, Bloating, Change in Bowel Habits, Chronic diarrhea, Constipation, Difficulty Swallowing, Excessive gas, Gets full quickly at meals, Indigestion, Nausea and Vomiting. Female Genitourinary Not Present- Frequency, Nocturia, Painful Urination, Pelvic Pain and Urgency. Musculoskeletal Present- Joint Stiffness. Not Present- Back Pain, Joint Pain, Muscle Pain, Muscle Weakness and Swelling of Extremities. Neurological Not Present- Decreased Memory, Fainting, Headaches, Numbness, Seizures, Tingling, Tremor, Trouble walking and Weakness. Psychiatric Not Present- Anxiety, Bipolar, Change in Sleep Pattern, Depression, Fearful and Frequent crying. Endocrine Present- Hot flashes. Not Present- Cold Intolerance, Excessive Hunger, Hair Changes,  Heat Intolerance and New Diabetes. Hematology Not Present- Blood Thinners, Easy Bruising, Excessive bleeding, Gland problems, HIV and Persistent Infections.  BP 126/77 (BP Location: Left Arm, Patient Position: Sitting)   Pulse 67   Temp 97.7 F (36.5 C) (Oral)   Resp 18   Ht 5\' 3"  (1.6 m)   Wt 69.4 kg (153 lb)   SpO2 100%   BMI  27.10 kg/m    Physical Exam   General Mental Status-Alert. General Appearance-Not in acute distress. Build & Nutrition-Well nourished. Posture-Normal posture. Gait-Normal.  Head and Neck Head-normocephalic, atraumatic with no lesions or palpable masses. Trachea-midline.  Chest and Lung Exam Chest and lung exam reveals -on auscultation, normal breath sounds, no adventitious sounds and normal vocal resonance.  Cardiovascular Cardiovascular examination reveals -normal heart sounds, regular rate and rhythm with no murmurs and no digital clubbing, cyanosis, edema, increased warmth or tenderness.  Abdomen Inspection Inspection of the abdomen reveals - No Hernias. Palpation/Percussion Palpation and Percussion of the abdomen reveal - Soft, Non Tender, No Rigidity (guarding), No hepatosplenomegaly and No Palpable abdominal masses.  Rectal Anorectal Exam Internal - normal sphincter tone. Note: Grade 3 prolapsing hemorrhoid  Neurologic Neurologic evaluation reveals -alert and oriented x 3 with no impairment of recent or remote memory, normal attention span and ability to concentrate, normal sensation and normal coordination.  Musculoskeletal Normal Exam - Bilateral-Upper Extremity Strength Normal and Lower Extremity Strength Normal.  ANOSCOPY, DIAGNOSTIC (38177) [ Hemorrhoids ] Procedure Other: Procedure: Anoscopy Surgeon: Marcello Moores After the risks and benefits were explained, verbal consent was obtained for above procedure. A medical assistant chaperone was present thoroughout the entire procedure. Anesthesia: none  Diagnosis: rectal bleeding Findings: Grade 3 right anterior internal hemorrhoid with signs of chronic inflammation, grade 1 right posterior internal hemorrhoid with signs of inflammation , grade 1 left lateral with no inflammation  Performed: 04/26/2017 11:08 AM    Assessment & Plan  PROLAPSED INTERNAL HEMORRHOIDS, GRADE 3 (K64.2) Impression: 56 year old female with a history of constipation who presents to the office with a grade 3 right anterior internal and external hemorrhoid. There is signs of chronic inflammation. I have recommended hemorrhoidectomy. She also has a grade 1 hemorrhoid in the right anterior position. We will perform a hemorrhoidal pexy at this location at the same time. We have discussed the typical time away from work and pain management regimens. Risks include bleeding and recurrence. All questions were answered.

## 2017-06-04 NOTE — Transfer of Care (Signed)
Immediate Anesthesia Transfer of Care Note  Patient: [name]  Procedure(s) Performed: Procedure(s): HEMORRHOIDECTOMY single column and HEMORRHOIDOPEXY (N/A)  Patient Location: PACU  Anesthesia Type:MAC  Level of Consciousness: awake, alert  and oriented  Airway & Oxygen Therapy: Patient Spontanous Breathing and Patient connected to nasal cannula oxygen  Post-op Assessment: Report given to RN  Post vital signs: Reviewed and stable  Last Vitals: 129/64, 81, 13, 100% Vitals:   06/04/17 0836  BP: 126/77  Pulse: 67  Resp: 18  Temp: 36.5 C    Last Pain:  Vitals:   06/04/17 0836  TempSrc: Oral      Patients Stated Pain Goal: 8 (67/61/95 0932)  Complications: No apparent anesthesia complications

## 2017-06-04 NOTE — Anesthesia Postprocedure Evaluation (Signed)
Anesthesia Post Note  Patient: [name]  Procedure(s) Performed: Procedure(s) (LRB): HEMORRHOIDECTOMY single column and HEMORRHOIDOPEXY (N/A)     Patient location during evaluation: PACU Anesthesia Type: MAC Level of consciousness: awake and alert Pain management: pain level controlled Vital Signs Assessment: post-procedure vital signs reviewed and stable Respiratory status: spontaneous breathing, nonlabored ventilation, respiratory function stable and patient connected to nasal cannula oxygen Cardiovascular status: stable and blood pressure returned to baseline Anesthetic complications: no    Last Vitals:  Vitals:   06/04/17 1145 06/04/17 1200  BP: 121/63 126/71  Pulse: 77 74  Resp: 14 11  Temp: 36.4 C     Last Pain:  Vitals:   06/04/17 0836  TempSrc: Oral                 Brinson Tozzi S

## 2017-06-04 NOTE — Op Note (Signed)
06/04/2017  11:26 AM  PATIENT:  [name]  56 y.o. female  Patient Care Team: Crecencio Mc, MD as PCP - General (Internal Medicine)  PRE-OPERATIVE DIAGNOSIS:  prolapsing hemorrhoids  POST-OPERATIVE DIAGNOSIS:  Grade 3 right anterior prolapsing hemorrhoid grade 2 right posterior prolapsing hemorrhoid  PROCEDURE:   HEMORRHOIDECTOMY single column and HEMORRHOIDOPEXY (right posterior)   SURGEON:  Surgeon(s): Leighton Ruff, MD  ASSISTANT: none   ANESTHESIA:   local and MAC  SPECIMEN:  Source of Specimen:  Right anterior hemorrhoid column  DISPOSITION OF SPECIMEN:  PATHOLOGY  COUNTS:  YES  PLAN OF CARE: Discharge to home after PACU  PATIENT DISPOSITION:  PACU - hemodynamically stable.  INDICATION: 56 year old female with chronic rectal bleeding and grade 3 internal hemorrhoid.   OR FINDINGS: Grade 3 right anterior hemorrhoid, grade 2 right posterior hemorrhoid, grade 1 left lateral internal hemorrhoid  DESCRIPTION: the patient was identified in the preoperative holding area and taken to the OR where they were laid on the operating room table.  MAC anesthesia was induced without difficulty. The patient was then positioned in prone jackknife position with buttocks gently taped apart.  The patient was then prepped and draped in usual sterile fashion.  SCDs were noted to be in place prior to the initiation of anesthesia. A surgical timeout was performed indicating the correct patient, procedure, positioning and need for preoperative antibiotics.  A rectal block was performed using Marcaine with epinephrine and Exparel.    I began with a digital rectal exam.  There were no masses palpated.  I then placed a Hill-Ferguson anoscope into the anal canal and evaluated this completely.  The patient had an obvious grade 3 right anterior hemorrhoid. There was a grade 2 right posterior hemorrhoid. Left lateral hemorrhoid tissue was minimal.  I began by performing the hemorrhoidectomy. The  Kelly clamp was placed on the right anterior hemorrhoid. An incision was made externally with a 10 blade scalpel.  I then used Metzenbaum scissors to dissect the hemorrhoidal tissue away from the speed or complex. I divided this down proximally. Once inside the anal canal, I placed a Kelly clamp on the remaining mucosa and divided this with a 10 blade scalpel. The specimen was sent to pathology for further examination. Hemostasis of a briskly bleeding vessel was performed using electrocautery. Once this was completed a 2-0 chromic running suture was used to close the mucosal layer to the dentate line. A 3-0 chromic running suture was used to close the external portion of the hemorrhoid. Hemostasis was good. I terminated attention to the right posterior internal hemorrhoid. A hemorrhoid pexy was performed using a 2-0 chromic suture. The patient tolerated this well and was awakened from anesthesia and sent to the postanesthesia care unit in stable condition. All counts were correct per operating room staff.

## 2017-06-04 NOTE — Anesthesia Preprocedure Evaluation (Signed)
Anesthesia Evaluation  Patient identified by MRN, date of birth, ID band Patient awake    Reviewed: Allergy & Precautions, H&P , NPO status , Patient's Chart, lab work & pertinent test results  History of Anesthesia Complications (+) PONV and history of anesthetic complications  Airway Mallampati: II   Neck ROM: full    Dental   Pulmonary neg pulmonary ROS,    breath sounds clear to auscultation       Cardiovascular negative cardio ROS   Rhythm:regular Rate:Normal     Neuro/Psych    GI/Hepatic   Endo/Other    Renal/GU      Musculoskeletal  (+) Arthritis ,   Abdominal   Peds  Hematology   Anesthesia Other Findings   Reproductive/Obstetrics                             Anesthesia Physical Anesthesia Plan  ASA: II  Anesthesia Plan: MAC   Post-op Pain Management:    Induction: Intravenous  PONV Risk Score and Plan: 4 or greater and Ondansetron, Dexamethasone, Midazolam and Propofol infusion  Airway Management Planned: Simple Face Mask  Additional Equipment:   Intra-op Plan:   Post-operative Plan:   Informed Consent: I have reviewed the patients History and Physical, chart, labs and discussed the procedure including the risks, benefits and alternatives for the proposed anesthesia with the patient or authorized representative who has indicated his/her understanding and acceptance.     Plan Discussed with: CRNA, Anesthesiologist and Surgeon  Anesthesia Plan Comments:         Anesthesia Quick Evaluation

## 2017-06-04 NOTE — Discharge Instructions (Addendum)
Post Anesthesia Home Care Instructions  Activity: Get plenty of rest for the remainder of the day. A responsible individual must stay with you for 24 hours following the procedure.  For the next 24 hours, DO NOT: -Drive a car -Paediatric nurse -Drink alcoholic beverages -Take any medication unless instructed by your physician -Make any legal decisions or sign important papers.  Meals: Start with liquid foods such as gelatin or soup. Progress to regular foods as tolerated. Avoid greasy, spicy, heavy foods. If nausea and/or vomiting occur, drink only clear liquids until the nausea and/or vomiting subsides. Call your physician if vomiting continues.  Special Instructions/Symptoms: Your throat may feel dry or sore from the anesthesia or the breathing tube placed in your throat during surgery. If this causes discomfort, gargle with warm salt water. The discomfort should disappear within 24 hours.  If you had a scopolamine patch placed behind your ear for the management of post- operative nausea and/or vomiting:  1. The medication in the patch is effective for 72 hours, after which it should be removed.  Wrap patch in a tissue and discard in the trash. Wash hands thoroughly with soap and water. 2. You may remove the patch earlier than 72 hours if you experience unpleasant side effects which may include dry mouth, dizziness or visual disturbances. 3. Avoid touching the patch. Wash your hands with soap and water after contact with the patch.   Information for Discharge Teaching: EXPAREL (bupivacaine liposome injectable suspension)   Your surgeon gave you EXPAREL(bupivacaine) in your surgical incision to help control your pain after surgery.   EXPAREL is a local anesthetic that provides pain relief by numbing the tissue around the surgical site.  EXPAREL is designed to release pain medication over time and can control pain for up to 72 hours.  Depending on how you respond to EXPAREL, you  may require less pain medication during your recovery.  Possible side effects:  Temporary loss of sensation or ability to move in the area where bupivacaine was injected.  Nausea, vomiting, constipation  Rarely, numbness and tingling in your mouth or lips, lightheadedness, or anxiety may occur.  Call your doctor right away if you think you may be experiencing any of these sensations, or if you have other questions regarding possible side effects.  Follow all other discharge instructions given to you by your surgeon or nurse. Eat a healthy diet and drink plenty of water or other fluids.  If you return to the hospital for any reason within 96 hours following the administration of EXPAREL, please inform your health care providers.ANORECTAL SURGERY: POST OP INSTRUCTIONS 1. Take your usually prescribed home medications unless otherwise directed. 2. DIET: During the first few hours after surgery sip on some liquids until you are able to urinate.  It is normal to not urinate for several hours after this surgery.  If you feel uncomfortable, please contact the office for instructions.  After you are able to urinate,you may eat, if you feel like it.  Follow a light bland diet the first 24 hours after arrival home, such as soup, liquids, crackers, etc.  Be sure to include lots of fluids daily (6-8 glasses).  Avoid fast food or heavy meals, as your are more likely to get nauseated.  Eat a low fat diet the next few days after surgery.  Limit caffeine intake to 1-2 servings a day. 3. PAIN CONTROL: a. Pain is best controlled by a usual combination of several different methods TOGETHER: i. Muscle relaxation:  Soak in a warm bath (or Sitz bath) three times a day and after bowel movements.  Continue to do this until all pain is resolved. ii. Over the counter pain medication iii. Prescription pain medication b. Most patients will experience some swelling and discomfort in the anus/rectal area and incisions.  Heat  such as warm towels, sitz baths, warm baths, etc to help relax tight/sore spots and speed recovery.  Some people prefer to use ice, especially in the first couple days after surgery, as it may decrease the pain and swelling, or alternate between ice & heat.  Experiment to what works for you.  Swelling and bruising can take several weeks to resolve.  Pain can take even longer to completely resolve. c. It is helpful to take an over-the-counter pain medication regularly for the first few weeks.  Choose one of the following that works best for you: i. Naproxen (Aleve, etc)  Two 220mg  tabs twice a day ii. Ibuprofen (Advil, etc) Three 200mg  tabs four times a day (every meal & bedtime) d. A  prescription for pain medication (such as percocet, oxycodone, hydrocodone, etc) should be given to you upon discharge.  Take your pain medication as prescribed.  i. If you are having problems/concerns with the prescription medicine (does not control pain, nausea, vomiting, rash, itching, etc), please call us [phone] to see if we need to switch you to a different pain medicine that will work better for you and/or control your side effect better. ii. If you need a refill on your pain medication, please contact your pharmacy.  They will contact our office to request authorization. Prescriptions will not be filled after 5 pm or on week-ends. 4. KEEP YOUR BOWELS REGULAR and AVOID CONSTIPATION a. The goal is one to two soft bowel movements a day.  You should at least have a bowel movement every other day. b. Avoid getting constipated.  Between the surgery and the pain medications, it is common to experience some constipation. This can be very painful after rectal surgery.  Increasing fluid intake and taking a fiber supplement (such as Metamucil, Citrucel, FiberCon, etc) 1-2 times a day regularly will usually help prevent this problem from occurring.  A stool softener like colace is also recommended.  This can be purchased  over the counter at your pharmacy.  You can take it up to 3 times a day.  If you do not have a bowel movement after 24 hrs since your surgery, take one does of milk of magnesia.  If you still haven't had a bowel movement 8-12 hours after that dose, take another dose.  If you don't have a bowel movement 48 hrs after surgery, purchase a Fleets enema from the drug store and administer gently per package instructions.  If you still are having trouble with your bowel movements after that, please call the office for further instructions. c. If you develop diarrhea or have many loose bowel movements, simplify your diet to bland foods & liquids for a few days.  Stop any stool softeners and decrease your fiber supplement.  Switching to mild anti-diarrheal medications (Kayopectate, Pepto Bismol) can help.  If this worsens or does not improve, please call us.  5. Wound Care a. Remove your bandages before your first bowel movement or 8 hours after surgery.     b. Remove any wound packing material at this tim,e as well.  You do not need to repack the wound unless instructed otherwise.  Wear an absorbent pad or  soft cotton gauze in your underwear to catch any drainage and help keep the area clean. You should change this every 2-3 hours while awake. c. Keep the area clean and dry.  Bathe / shower every day, especially after bowel movements.  Keep the area clean by showering / bathing over the incision / wound.   It is okay to soak an open wound to help wash it.  Wet wipes or showers / gentle washing after bowel movements is often less traumatic than regular toilet paper. d. Dennis Bast may have some styrofoam-like soft packing in the rectum which will come out with the first bowel movement.  e. You will often notice bleeding with bowel movements.  This should slow down by the end of the first week of surgery f. Expect some drainage.  This should slow down, too, by the end of the first week of surgery.  Wear an absorbent pad or  soft cotton gauze in your underwear until the drainage stops. g. Do Not sit on a rubber or pillow ring.  This can make you symptoms worse.  You may sit on a soft pillow if needed.  6. ACTIVITIES as tolerated:   a. You may resume regular (light) daily activities beginning the next day--such as daily self-care, walking, climbing stairs--gradually increasing activities as tolerated.  If you can walk 30 minutes without difficulty, it is safe to try more intense activity such as jogging, treadmill, bicycling, low-impact aerobics, swimming, etc. b. Save the most intensive and strenuous activity for last such as sit-ups, heavy lifting, contact sports, etc  Refrain from any heavy lifting or straining until you are off narcotics for pain control.   c. You may drive when you are no longer taking prescription pain medication, you can comfortably sit for long periods of time, and you can safely maneuver your car and apply brakes. d. Dennis Bast may have sexual intercourse when it is comfortable.  7. FOLLOW UP in our office a. Please call [facility] at [phone] to set up an appointment to see your surgeon in the office for a follow-up appointment approximately 3-4 weeks after your surgery. b. Make sure that you call for this appointment the day you arrive home to insure a convenient appointment time. 10. IF YOU HAVE DISABILITY OR FAMILY LEAVE FORMS, BRING THEM TO THE OFFICE FOR PROCESSING.  DO NOT GIVE THEM TO YOUR DOCTOR.     WHEN TO CALL us [phone]: 1. Poor pain control 2. Reactions / problems with new medications (rash/itching, nausea, etc)  3. Fever over 101.5 F (38.5 C) 4. Inability to urinate 5. Nausea and/or vomiting 6. Worsening swelling or bruising 7. Continued bleeding from incision. 8. Increased pain, redness, or drainage from the incision  The clinic staff is available to answer your questions during regular business hours (8:30am-5pm).  Please dont hesitate to call and ask to speak to one of  our nurses for clinical concerns.   A surgeon from [facility] is always on call at the hospitals   If you have a medical emergency, go to the nearest emergency room or call 911.    [facility], [address], [place], [place], [place]  [address] ? MAIN: [phone] ? TOLL FREE: [phone] ? FAX [phone] [email or web address]

## 2017-06-04 NOTE — Progress Notes (Signed)
Dr. Grandville Silos notified that patient had clot to dislodge while urinating and urine bloody from the rectal blood. Once rectal area cleaned no dripping of blood noted. Clean dressing reapplied and patient continues to be monitored.

## 2017-06-04 NOTE — Progress Notes (Signed)
Clothing returned to patient and spouse

## 2017-06-07 ENCOUNTER — Encounter (HOSPITAL_BASED_OUTPATIENT_CLINIC_OR_DEPARTMENT_OTHER): Payer: Self-pay | Admitting: General Surgery

## 2017-07-26 DIAGNOSIS — H524 Presbyopia: Secondary | ICD-10-CM | POA: Diagnosis not present

## 2017-08-24 DIAGNOSIS — R030 Elevated blood-pressure reading, without diagnosis of hypertension: Secondary | ICD-10-CM | POA: Diagnosis not present

## 2017-08-24 DIAGNOSIS — Z6827 Body mass index (BMI) 27.0-27.9, adult: Secondary | ICD-10-CM | POA: Diagnosis not present

## 2017-08-24 DIAGNOSIS — M4722 Other spondylosis with radiculopathy, cervical region: Secondary | ICD-10-CM | POA: Diagnosis not present

## 2017-08-24 DIAGNOSIS — M542 Cervicalgia: Secondary | ICD-10-CM | POA: Diagnosis not present

## 2017-08-24 DIAGNOSIS — M5030 Other cervical disc degeneration, unspecified cervical region: Secondary | ICD-10-CM | POA: Diagnosis not present

## 2017-08-24 DIAGNOSIS — M5412 Radiculopathy, cervical region: Secondary | ICD-10-CM | POA: Diagnosis not present

## 2017-09-06 ENCOUNTER — Encounter: Payer: Self-pay | Admitting: Internal Medicine

## 2017-09-06 ENCOUNTER — Ambulatory Visit (INDEPENDENT_AMBULATORY_CARE_PROVIDER_SITE_OTHER): Payer: 59 | Admitting: Internal Medicine

## 2017-09-06 VITALS — BP 134/88 | HR 66 | Temp 97.6°F | Resp 14 | Ht 63.0 in | Wt 158.8 lb

## 2017-09-06 DIAGNOSIS — Z1239 Encounter for other screening for malignant neoplasm of breast: Secondary | ICD-10-CM

## 2017-09-06 DIAGNOSIS — Z1231 Encounter for screening mammogram for malignant neoplasm of breast: Secondary | ICD-10-CM

## 2017-09-06 DIAGNOSIS — E041 Nontoxic single thyroid nodule: Secondary | ICD-10-CM

## 2017-09-06 DIAGNOSIS — E010 Iodine-deficiency related diffuse (endemic) goiter: Secondary | ICD-10-CM

## 2017-09-06 DIAGNOSIS — R635 Abnormal weight gain: Secondary | ICD-10-CM | POA: Diagnosis not present

## 2017-09-06 DIAGNOSIS — Z0000 Encounter for general adult medical examination without abnormal findings: Secondary | ICD-10-CM

## 2017-09-06 DIAGNOSIS — Z1159 Encounter for screening for other viral diseases: Secondary | ICD-10-CM

## 2017-09-06 LAB — COMPREHENSIVE METABOLIC PANEL
ALBUMIN: 4.1 g/dL (ref 3.5–5.2)
ALT: 16 U/L (ref 0–35)
AST: 17 U/L (ref 0–37)
Alkaline Phosphatase: 62 U/L (ref 39–117)
BUN: 16 mg/dL (ref 6–23)
CHLORIDE: 101 meq/L (ref 96–112)
CO2: 31 meq/L (ref 19–32)
CREATININE: 0.87 mg/dL (ref 0.40–1.20)
Calcium: 9.2 mg/dL (ref 8.4–10.5)
GFR: 71.23 mL/min (ref >60.00)
GLUCOSE: 96 mg/dL (ref 70–99)
POTASSIUM: 4.3 meq/L (ref 3.5–5.1)
SODIUM: 136 meq/L (ref 135–145)
Total Bilirubin: 0.6 mg/dL (ref 0.2–1.2)
Total Protein: 6.8 g/dL (ref 6.0–8.3)

## 2017-09-06 LAB — LIPID PANEL
Cholesterol: 228 mg/dL — ABNORMAL HIGH (ref 0–200)
HDL: 68.5 mg/dL (ref >39.00)
LDL CALC: 121 mg/dL — AB (ref 0–99)
NonHDL: 159.49
Total CHOL/HDL Ratio: 3
Triglycerides: 190 mg/dL — ABNORMAL HIGH (ref 0.0–149.0)
VLDL: 38 mg/dL (ref 0.0–40.0)

## 2017-09-06 LAB — TSH: TSH: 1.53 u[IU]/mL (ref 0.35–4.50)

## 2017-09-06 LAB — T4: T4 TOTAL: 6.4 ug/dL (ref 5.1–11.9)

## 2017-09-06 LAB — HEMOGLOBIN A1C: Hgb A1c MFr Bld: 5.1 % (ref 4.6–6.5)

## 2017-09-06 NOTE — Progress Notes (Signed)
Patient ID: Herminio Commons, female    DOB: Oct 15, 1961  Age: 56 y.o. MRN: [id number]  The patient is here for annual  non gyn examination and management of other chronic and acute problems.  Last seen Feb 2017  Wt gain of 2 lbs since then Hemorrhoid surgery , ext  By [place] great!@!!    Had no pain . Bowels are solid moving regularly . Last mammo 2016    Discussed overweight  Previous success with HCG , can't get it anymore Diet and exercise discussed    The risk factors are reflected in the social history.  The roster of all physicians providing medical care to patient - is listed in the Snapshot section of the chart.  Activities of daily living:  The patient is 100% independent in all ADLs: dressing, toileting, feeding as well as independent mobility  Home safety : The patient has smoke detectors in the home. They wear seatbelts.  There are no firearms at home. There is no violence in the home.   There is no risks for hepatitis, STDs or HIV. There is no   history of blood transfusion. They have no travel history to infectious disease endemic areas of the world.  The patient has seen their dentist in the last six month. They have seen their eye doctor in the last year. T They do not  have excessive sun exposure. Discussed the need for sun protection: hats, long sleeves and use of sunscreen if there is significant sun exposure.   Diet: the importance of a healthy diet is discussed. They do have a healthy diet.  The benefits of regular aerobic exercise were discussed. She walks 4 times per week ,  20 minutes.   Depression screen: there are no signs or vegative symptoms of depression- irritability, change in appetite, anhedonia, sadness/tearfullness.   The following portions of the patient's history were reviewed and updated as appropriate: allergies, current medications, past family history, past medical history,  past surgical history, past social history  and problem  list.  Visual acuity was not assessed per patient preference since she has regular follow up with her ophthalmologist. Hearing and body mass index were assessed and reviewed.   During the course of the visit the patient was educated and counseled about appropriate screening and preventive services including : fall prevention , diabetes screening, nutrition counseling, colorectal cancer screening, and recommended immunizations.    CC: The primary encounter diagnosis was Weight gain. Diagnoses of Thyroid nodule, Breast cancer screening, Encounter for hepatitis C screening test for low risk patient, Encounter for preventive health examination, and Thyromegaly were also pertinent to this visit.  History Gabryela has a past medical history of DDD (degenerative disc disease), cervical, History of colon polyps, History of endometriosis, History of palpitations, History of uterine leiomyoma, Irritable bowel syndrome (IBS), Lipoma of thigh, OA (osteoarthritis), PONV (postoperative nausea and vomiting), Prolapsed hemorrhoids, Solitary right kidney (2011), Trochanteric bursitis of left hip, Vitamin D deficiency, and Wears contact lenses.   She has a past surgical history that includes Nephrectomy living donor (Left, 2011); Laparoscopic vaginal hysterectomy with salpingo oophorectomy (Bilateral, 04-05-2008  dr Phineas Real at [facility]); Colonoscopy (last one 09-18-2013); Tonsillectomy (age 28); DX LAPAROSCOPY W/ FULGERATION ENDOMETRIOSIS (1993;  1995); Dilation and curettage of uterus (1993); and HEMORRHOIDECTOMY single column and HEMORRHOIDOPEXY (N/A, 06/04/2017).   Her family history includes Cancer in her paternal aunt and sister; Cancer (age of onset: 64) in her sister; Cancer (age of onset: 17)  in her sister; Hearing loss in her daughter; Mental illness (age of onset: 62) in her father.She reports that  has never smoked. she has never used smokeless tobacco. She reports that she drinks about 1.2 oz of alcohol per week. She  reports that she does not use drugs.  Outpatient Medications Prior to Visit  Medication Sig Dispense Refill  . estradiol (VIVELLE-DOT) 0.1 MG/24HR patch PLACE 1 PATCH ONTO THE SKIN 2 TIMES A WEEK. 8 patch 11  . Magnesium 400 MG TABS Take 1 tablet by mouth 2 (two) times daily.    Marland Kitchen PARoxetine (PAXIL) 20 MG tablet Take 1 tablet (20 mg total) by mouth every morning. (Patient taking differently: Take 1 tablet (20 mg total) by mouth every morning.---- per pt takes 1/4 tablet in evening) 90 tablet 1  . Potassium 99 MG TABS Take 1 tablet by mouth 2 (two) times daily.     Marland Kitchen oxyCODONE (OXY IR/ROXICODONE) 5 MG immediate release tablet Take 1-2 tablets (5-10 mg total) by mouth every 4 (four) hours as needed. (Patient not taking: Reported on 09/06/2017) 30 tablet 0   No facility-administered medications prior to visit.     Review of Systems  Patient denies headache, fevers, malaise, unintentional weight loss, skin rash, eye pain, sinus congestion and sinus pain, sore throat, dysphagia,  hemoptysis , cough, dyspnea, wheezing, chest pain, palpitations, orthopnea, edema, abdominal pain, nausea, melena, diarrhea, constipation, flank pain, dysuria, hematuria, urinary  Frequency, nocturia, numbness, tingling, seizures,  Focal weakness, Loss of consciousness,  Tremor, insomnia, depression, anxiety, and suicidal ideation.     Objective:  BP 134/88 (BP Location: Left Arm, Patient Position: Sitting, Cuff Size: Normal)   Pulse 66   Temp 97.6 F (36.4 C) (Oral)   Resp 14   Ht 5\' 3"  (1.6 m)   Wt 158 lb 12.8 oz (72 kg)   SpO2 98%   BMI 28.13 kg/m   Physical Exam   General appearance: alert, cooperative and appears stated age Head: Normocephalic, without obvious abnormality, atraumatic Eyes: conjunctivae/corneas clear. PERRL, EOM's intact. Fundi benign. Ears: normal TM's and external ear canals both ears Nose: Nares normal. Septum midline. Mucosa normal. No drainage or sinus tenderness. Throat: lips,  mucosa, and tongue normal; teeth and gums normal Neck: no adenopathy, no carotid bruit, no JVD, supple, symmetrical, trachea midline and thyroid not enlarged, symmetric, no tenderness/mass/nodules Lungs: clear to auscultation bilaterally Breasts: normal appearance, no masses or tenderness Heart: regular rate and rhythm, S1, S2 normal, no murmur, click, rub or gallop Abdomen: soft, non-tender; bowel sounds normal; no masses,  no organomegaly Extremities: extremities normal, atraumatic, no cyanosis or edema Pulses: 2+ and symmetric Skin: Skin color, texture, turgor normal. No rashes or lesions Neurologic: Alert and oriented X 3, normal strength and tone. Normal symmetric reflexes. Normal coordination and gait.      Assessment & Plan:   Problem List Items Addressed This Visit    Encounter for hepatitis C screening test for low risk patient   Relevant Orders   Hepatitis C antibody (Completed)   Encounter for preventive health examination    Annual comprehensive preventive exam was done as well as an evaluation and management of chronic conditions .  During the course of the visit the patient was educated and counseled about appropriate screening and preventive services including :  diabetes screening, lipid analysis with projected  10 year  risk for CAD , nutrition counseling, breast, cervical and colorectal cancer screening, and recommended immunizations.  Printed recommendations for health maintenance  screenings was giveN      Thyromegaly    Ultrasound ordered.   Lab Results  Component Value Date   TSH 1.53 09/06/2017           Other Visit Diagnoses    Weight gain    -  Primary   Relevant Orders   Comprehensive metabolic panel (Completed)   Hemoglobin A1c (Completed)   Lipid panel (Completed)   Thyroid nodule       Relevant Orders   US Soft Tissue Head/Neck   TSH (Completed)   T4 (Completed)   Breast cancer screening       Relevant Orders   MM SCREENING BREAST TOMO  BILATERAL      I have discontinued Belma K. Obeirne's oxyCODONE. I am also having her maintain her Potassium, PARoxetine, estradiol, and Magnesium.  No orders of the defined types were placed in this encounter.   Medications Discontinued During This Encounter  Medication Reason  . oxyCODONE (OXY IR/ROXICODONE) 5 MG immediate release tablet Patient has not taken in last 30 days    Follow-up: Return in about 1 year (around 09/06/2018).   Crecencio Mc, MD

## 2017-09-06 NOTE — Patient Instructions (Addendum)
I have ordered your mammogram and your thyroid ultrasound  screening for Hep C today  Colonoscopy is not  due until 2024  Tdap in 2023   Your sons may want to see Eric Sonnenberg or Tracey McLean (both MD's) for care    Health Maintenance for Postmenopausal Women Menopause is a normal process in which your reproductive ability comes to an end. This process happens gradually over a span of months to years, usually between the ages of 48 and 55. Menopause is complete when you have missed 12 consecutive menstrual periods. It is important to talk with your health care provider about some of the most common conditions that affect postmenopausal women, such as heart disease, cancer, and bone loss (osteoporosis). Adopting a healthy lifestyle and getting preventive care can help to promote your health and wellness. Those actions can also lower your chances of developing some of these common conditions. What should I know about menopause? During menopause, you may experience a number of symptoms, such as:  Moderate-to-severe hot flashes.  Night sweats.  Decrease in sex drive.  Mood swings.  Headaches.  Tiredness.  Irritability.  Memory problems.  Insomnia.  Choosing to treat or not to treat menopausal changes is an individual decision that you make with your health care provider. What should I know about hormone replacement therapy and supplements? Hormone therapy products are effective for treating symptoms that are associated with menopause, such as hot flashes and night sweats. Hormone replacement carries certain risks, especially as you become older. If you are thinking about using estrogen or estrogen with progestin treatments, discuss the benefits and risks with your health care provider. What should I know about heart disease and stroke? Heart disease, heart attack, and stroke become more likely as you age. This may be due, in part, to the hormonal changes that your body  experiences during menopause. These can affect how your body processes dietary fats, triglycerides, and cholesterol. Heart attack and stroke are both medical emergencies. There are many things that you can do to help prevent heart disease and stroke:  Have your blood pressure checked at least every 1-2 years. High blood pressure causes heart disease and increases the risk of stroke.  If you are 55-79 years old, ask your health care provider if you should take aspirin to prevent a heart attack or a stroke.  Do not use any tobacco products, including cigarettes, chewing tobacco, or electronic cigarettes. If you need help quitting, ask your health care provider.  It is important to eat a healthy diet and maintain a healthy weight. ? Be sure to include plenty of vegetables, fruits, low-fat dairy products, and lean protein. ? Avoid eating foods that are high in solid fats, added sugars, or salt (sodium).  Get regular exercise. This is one of the most important things that you can do for your health. ? Try to exercise for at least 150 minutes each week. The type of exercise that you do should increase your heart rate and make you sweat. This is known as moderate-intensity exercise. ? Try to do strengthening exercises at least twice each week. Do these in addition to the moderate-intensity exercise.  Know your numbers.Ask your health care provider to check your cholesterol and your blood glucose. Continue to have your blood tested as directed by your health care provider.  What should I know about cancer screening? There are several types of cancer. Take the following steps to reduce your risk and to catch any cancer development   as early as possible. Breast Cancer  Practice breast self-awareness. ? This means understanding how your breasts normally appear and feel. ? It also means doing regular breast self-exams. Let your health care provider know about any changes, no matter how small.  If you  are 75 or older, have a clinician do a breast exam (clinical breast exam or CBE) every year. Depending on your age, family history, and medical history, it may be recommended that you also have a yearly breast X-ray (mammogram).  If you have a family history of breast cancer, talk with your health care provider about genetic screening.  If you are at high risk for breast cancer, talk with your health care provider about having an MRI and a mammogram every year.  Breast cancer (BRCA) gene test is recommended for women who have family members with BRCA-related cancers. Results of the assessment will determine the need for genetic counseling and BRCA1 and for BRCA2 testing. BRCA-related cancers include these types: ? Breast. This occurs in males or females. ? Ovarian. ? Tubal. This may also be called fallopian tube cancer. ? Cancer of the abdominal or pelvic lining (peritoneal cancer). ? Prostate. ? Pancreatic.  Cervical, Uterine, and Ovarian Cancer Your health care provider may recommend that you be screened regularly for cancer of the pelvic organs. These include your ovaries, uterus, and vagina. This screening involves a pelvic exam, which includes checking for microscopic changes to the surface of your cervix (Pap test).  For women ages 21-65, health care providers may recommend a pelvic exam and a Pap test every three years. For women ages 22-65, they may recommend the Pap test and pelvic exam, combined with testing for human papilloma virus (HPV), every five years. Some types of HPV increase your risk of cervical cancer. Testing for HPV may also be done on women of any age who have unclear Pap test results.  Other health care providers may not recommend any screening for nonpregnant women who are considered low risk for pelvic cancer and have no symptoms. Ask your health care provider if a screening pelvic exam is right for you.  If you have had past treatment for cervical cancer or a  condition that could lead to cancer, you need Pap tests and screening for cancer for at least 20 years after your treatment. If Pap tests have been discontinued for you, your risk factors (such as having a new sexual partner) need to be reassessed to determine if you should start having screenings again. Some women have medical problems that increase the chance of getting cervical cancer. In these cases, your health care provider may recommend that you have screening and Pap tests more often.  If you have a family history of uterine cancer or ovarian cancer, talk with your health care provider about genetic screening.  If you have vaginal bleeding after reaching menopause, tell your health care provider.  There are currently no reliable tests available to screen for ovarian cancer.  Lung Cancer Lung cancer screening is recommended for adults 59-24 years old who are at high risk for lung cancer because of a history of smoking. A yearly low-dose CT scan of the lungs is recommended if you:  Currently smoke.  Have a history of at least 30 pack-years of smoking and you currently smoke or have quit within the past 15 years. A pack-year is smoking an average of one pack of cigarettes per day for one year.  Yearly screening should:  Continue until it has  been 15 years since you quit.  Stop if you develop a health problem that would prevent you from having lung cancer treatment.  Colorectal Cancer  This type of cancer can be detected and can often be prevented.  Routine colorectal cancer screening usually begins at age 50 and continues through age 75.  If you have risk factors for colon cancer, your health care provider may recommend that you be screened at an earlier age.  If you have a family history of colorectal cancer, talk with your health care provider about genetic screening.  Your health care provider may also recommend using home test kits to check for hidden blood in your stool.  A  small camera at the end of a tube can be used to examine your colon directly (sigmoidoscopy or colonoscopy). This is done to check for the earliest forms of colorectal cancer.  Direct examination of the colon should be repeated every 5-10 years until age 75. However, if early forms of precancerous polyps or small growths are found or if you have a family history or genetic risk for colorectal cancer, you may need to be screened more often.  Skin Cancer  Check your skin from head to toe regularly.  Monitor any moles. Be sure to tell your health care provider: ? About any new moles or changes in moles, especially if there is a change in a mole's shape or color. ? If you have a mole that is larger than the size of a pencil eraser.  If any of your family members has a history of skin cancer, especially at a young age, talk with your health care provider about genetic screening.  Always use sunscreen. Apply sunscreen liberally and repeatedly throughout the day.  Whenever you are outside, protect yourself by wearing long sleeves, pants, a wide-brimmed hat, and sunglasses.  What should I know about osteoporosis? Osteoporosis is a condition in which bone destruction happens more quickly than new bone creation. After menopause, you may be at an increased risk for osteoporosis. To help prevent osteoporosis or the bone fractures that can happen because of osteoporosis, the following is recommended:  If you are 19-50 years old, get at least 1,000 mg of calcium and at least 600 mg of vitamin D per day.  If you are older than age 50 but younger than age 70, get at least 1,200 mg of calcium and at least 600 mg of vitamin D per day.  If you are older than age 70, get at least 1,200 mg of calcium and at least 800 mg of vitamin D per day.  Smoking and excessive alcohol intake increase the risk of osteoporosis. Eat foods that are rich in calcium and vitamin D, and do weight-bearing exercises several times  each week as directed by your health care provider. What should I know about how menopause affects my mental health? Depression may occur at any age, but it is more common as you become older. Common symptoms of depression include:  Low or sad mood.  Changes in sleep patterns.  Changes in appetite or eating patterns.  Feeling an overall lack of motivation or enjoyment of activities that you previously enjoyed.  Frequent crying spells.  Talk with your health care provider if you think that you are experiencing depression. What should I know about immunizations? It is important that you get and maintain your immunizations. These include:  Tetanus, diphtheria, and pertussis (Tdap) booster vaccine.  Influenza every year before the flu season begins.    Pneumonia vaccine.  Shingles vaccine.  Your health care provider may also recommend other immunizations. This information is not intended to replace advice given to you by your health care provider. Make sure you discuss any questions you have with your health care provider. Document Released: 12/11/2005 Document Revised: 05/08/2016 Document Reviewed: 07/23/2015 Elsevier Interactive Patient Education  2018 [employer]  

## 2017-09-07 ENCOUNTER — Encounter: Payer: Self-pay | Admitting: Internal Medicine

## 2017-09-07 DIAGNOSIS — Z1159 Encounter for screening for other viral diseases: Secondary | ICD-10-CM | POA: Insufficient documentation

## 2017-09-07 DIAGNOSIS — E041 Nontoxic single thyroid nodule: Secondary | ICD-10-CM | POA: Insufficient documentation

## 2017-09-07 LAB — HEPATITIS C ANTIBODY
Hepatitis C Ab: NONREACTIVE
SIGNAL TO CUT-OFF: 0.02 (ref <1.00)

## 2017-09-07 NOTE — Assessment & Plan Note (Signed)
Annual comprehensive preventive exam was done as well as an evaluation and management of chronic conditions .  During the course of the visit the patient was educated and counseled about appropriate screening and preventive services including :  diabetes screening, lipid analysis with projected  10 year  risk for CAD , nutrition counseling, breast, cervical and colorectal cancer screening, and recommended immunizations.  Printed recommendations for health maintenance screenings was giveN 

## 2017-09-07 NOTE — Assessment & Plan Note (Signed)
Ultrasound ordered.   Lab Results  Component Value Date   TSH 1.53 09/06/2017

## 2017-09-15 ENCOUNTER — Ambulatory Visit: Payer: 59

## 2017-09-16 ENCOUNTER — Ambulatory Visit: Payer: 59

## 2017-10-04 ENCOUNTER — Ambulatory Visit: Payer: 59

## 2017-10-04 DIAGNOSIS — D1720 Benign lipomatous neoplasm of skin and subcutaneous tissue of unspecified limb: Secondary | ICD-10-CM | POA: Diagnosis not present

## 2017-10-07 ENCOUNTER — Ambulatory Visit
Admission: RE | Admit: 2017-10-07 | Discharge: 2017-10-07 | Disposition: A | Discharge status: Home / Self Care | Payer: 59 | Source: Ambulatory Visit | Attending: Internal Medicine | Admitting: Internal Medicine

## 2017-10-07 DIAGNOSIS — E042 Nontoxic multinodular goiter: Secondary | ICD-10-CM | POA: Diagnosis not present

## 2017-10-07 DIAGNOSIS — E041 Nontoxic single thyroid nodule: Secondary | ICD-10-CM

## 2017-10-08 ENCOUNTER — Encounter: Payer: Self-pay | Admitting: Internal Medicine

## 2017-10-20 ENCOUNTER — Ambulatory Visit
Admission: RE | Admit: 2017-10-20 | Discharge: 2017-10-20 | Disposition: A | Discharge status: Home / Self Care | Payer: 59 | Source: Ambulatory Visit | Attending: Internal Medicine | Admitting: Internal Medicine

## 2017-10-20 DIAGNOSIS — Z1231 Encounter for screening mammogram for malignant neoplasm of breast: Secondary | ICD-10-CM | POA: Insufficient documentation

## 2017-10-20 DIAGNOSIS — Z1239 Encounter for other screening for malignant neoplasm of breast: Secondary | ICD-10-CM

## 2017-10-23 ENCOUNTER — Encounter: Payer: Self-pay | Admitting: Internal Medicine

## 2017-11-08 DIAGNOSIS — D1720 Benign lipomatous neoplasm of skin and subcutaneous tissue of unspecified limb: Secondary | ICD-10-CM | POA: Diagnosis not present

## 2017-11-18 DIAGNOSIS — D171 Benign lipomatous neoplasm of skin and subcutaneous tissue of trunk: Secondary | ICD-10-CM | POA: Diagnosis not present

## 2017-11-18 DIAGNOSIS — D1723 Benign lipomatous neoplasm of skin and subcutaneous tissue of right leg: Secondary | ICD-10-CM | POA: Diagnosis not present

## 2017-11-18 DIAGNOSIS — D179 Benign lipomatous neoplasm, unspecified: Secondary | ICD-10-CM | POA: Diagnosis not present

## 2017-11-18 DIAGNOSIS — D1724 Benign lipomatous neoplasm of skin and subcutaneous tissue of left leg: Secondary | ICD-10-CM | POA: Diagnosis not present

## 2018-01-20 DIAGNOSIS — M5412 Radiculopathy, cervical region: Secondary | ICD-10-CM | POA: Diagnosis not present

## 2018-01-20 DIAGNOSIS — M542 Cervicalgia: Secondary | ICD-10-CM | POA: Diagnosis not present

## 2018-02-22 DIAGNOSIS — M75101 Unspecified rotator cuff tear or rupture of right shoulder, not specified as traumatic: Secondary | ICD-10-CM | POA: Diagnosis not present

## 2018-02-22 DIAGNOSIS — M5412 Radiculopathy, cervical region: Secondary | ICD-10-CM | POA: Diagnosis not present

## 2018-02-22 DIAGNOSIS — M4722 Other spondylosis with radiculopathy, cervical region: Secondary | ICD-10-CM | POA: Diagnosis not present

## 2018-02-22 DIAGNOSIS — M542 Cervicalgia: Secondary | ICD-10-CM | POA: Diagnosis not present

## 2018-02-22 DIAGNOSIS — M5030 Other cervical disc degeneration, unspecified cervical region: Secondary | ICD-10-CM | POA: Diagnosis not present

## 2018-08-04 DIAGNOSIS — M7541 Impingement syndrome of right shoulder: Secondary | ICD-10-CM | POA: Diagnosis not present

## 2018-08-04 DIAGNOSIS — M7711 Lateral epicondylitis, right elbow: Secondary | ICD-10-CM | POA: Diagnosis not present

## 2018-08-08 ENCOUNTER — Other Ambulatory Visit: Payer: Self-pay | Admitting: Internal Medicine

## 2018-09-01 DIAGNOSIS — M7711 Lateral epicondylitis, right elbow: Secondary | ICD-10-CM | POA: Diagnosis not present

## 2018-09-01 DIAGNOSIS — M7541 Impingement syndrome of right shoulder: Secondary | ICD-10-CM | POA: Diagnosis not present

## 2018-09-08 ENCOUNTER — Ambulatory Visit (INDEPENDENT_AMBULATORY_CARE_PROVIDER_SITE_OTHER): Payer: 59 | Admitting: Internal Medicine

## 2018-09-08 ENCOUNTER — Encounter: Payer: Self-pay | Admitting: Internal Medicine

## 2018-09-08 VITALS — BP 136/80 | HR 75 | Temp 98.4°F | Resp 14 | Ht 63.0 in | Wt 155.0 lb

## 2018-09-08 DIAGNOSIS — E785 Hyperlipidemia, unspecified: Secondary | ICD-10-CM | POA: Diagnosis not present

## 2018-09-08 DIAGNOSIS — R5383 Other fatigue: Secondary | ICD-10-CM

## 2018-09-08 DIAGNOSIS — Z1239 Encounter for other screening for malignant neoplasm of breast: Secondary | ICD-10-CM

## 2018-09-08 DIAGNOSIS — R1902 Left upper quadrant abdominal swelling, mass and lump: Secondary | ICD-10-CM

## 2018-09-08 DIAGNOSIS — D1724 Benign lipomatous neoplasm of skin and subcutaneous tissue of left leg: Secondary | ICD-10-CM | POA: Diagnosis not present

## 2018-09-08 DIAGNOSIS — Z0001 Encounter for general adult medical examination with abnormal findings: Secondary | ICD-10-CM

## 2018-09-08 DIAGNOSIS — E041 Nontoxic single thyroid nodule: Secondary | ICD-10-CM | POA: Diagnosis not present

## 2018-09-08 DIAGNOSIS — Z0000 Encounter for general adult medical examination without abnormal findings: Secondary | ICD-10-CM

## 2018-09-08 DIAGNOSIS — Z800 Family history of malignant neoplasm of digestive organs: Secondary | ICD-10-CM

## 2018-09-08 DIAGNOSIS — D239 Other benign neoplasm of skin, unspecified: Secondary | ICD-10-CM

## 2018-09-08 DIAGNOSIS — E663 Overweight: Secondary | ICD-10-CM

## 2018-09-08 LAB — COMPREHENSIVE METABOLIC PANEL
ALBUMIN: 4.5 g/dL (ref 3.5–5.2)
ALT: 18 U/L (ref 0–35)
AST: 19 U/L (ref 0–37)
Alkaline Phosphatase: 59 U/L (ref 39–117)
BUN: 20 mg/dL (ref 6–23)
CALCIUM: 9.6 mg/dL (ref 8.4–10.5)
CHLORIDE: 99 meq/L (ref 96–112)
CO2: 33 meq/L — AB (ref 19–32)
Creatinine, Ser: 0.97 mg/dL (ref 0.40–1.20)
GFR: 62.6 mL/min (ref >60.00)
Glucose, Bld: 95 mg/dL (ref 70–99)
POTASSIUM: 4.4 meq/L (ref 3.5–5.1)
Sodium: 136 mEq/L (ref 135–145)
Total Bilirubin: 0.6 mg/dL (ref 0.2–1.2)
Total Protein: 7.4 g/dL (ref 6.0–8.3)

## 2018-09-08 LAB — LIPID PANEL
CHOL/HDL RATIO: 3
CHOLESTEROL: 265 mg/dL — AB (ref 0–200)
HDL: 77.4 mg/dL (ref >39.00)
LDL CALC: 165 mg/dL — AB (ref 0–99)
NonHDL: 187.47
TRIGLYCERIDES: 110 mg/dL (ref 0.0–149.0)
VLDL: 22 mg/dL (ref 0.0–40.0)

## 2018-09-08 LAB — TSH: TSH: 1.42 u[IU]/mL (ref 0.35–4.50)

## 2018-09-08 MED ORDER — TRAMADOL HCL 50 MG PO TABS: 50.0000 mg | ORAL_TABLET | Freq: Three times a day (TID) | ORAL | 0 refills | Status: DC | PRN

## 2018-09-08 NOTE — Patient Instructions (Signed)
Derm and GI referrals  .Your annual mammogram has been ordered.  You are encouraged (required) to call to make your appointment at [facility] [phone]  The ShingRx vaccine is now available in local pharmacies and is much more protective thant Zostavaxs,  It is therefore ADVISED for all interested adults over 50 to prevent shingles   Abdominal ultrasound ordered   Health Maintenance for Postmenopausal Women Menopause is a normal process in which your reproductive ability comes to an end. This process happens gradually over a span of months to years, usually between the ages of 74 and 50. Menopause is complete when you have missed 12 consecutive menstrual periods. It is important to talk with your health care provider about some of the most common conditions that affect postmenopausal women, such as heart disease, cancer, and bone loss (osteoporosis). Adopting a healthy lifestyle and getting preventive care can help to promote your health and wellness. Those actions can also lower your chances of developing some of these common conditions. What should I know about menopause? During menopause, you may experience a number of symptoms, such as:  Moderate-to-severe hot flashes.  Night sweats.  Decrease in sex drive.  Mood swings.  Headaches.  Tiredness.  Irritability.  Memory problems.  Insomnia.  Choosing to treat or not to treat menopausal changes is an individual decision that you make with your health care provider. What should I know about hormone replacement therapy and supplements? Hormone therapy products are effective for treating symptoms that are associated with menopause, such as hot flashes and night sweats. Hormone replacement carries certain risks, especially as you become older. If you are thinking about using estrogen or estrogen with progestin treatments, discuss the benefits and risks with your health care provider. What should I know about heart disease and  stroke? Heart disease, heart attack, and stroke become more likely as you age. This may be due, in part, to the hormonal changes that your body experiences during menopause. These can affect how your body processes dietary fats, triglycerides, and cholesterol. Heart attack and stroke are both medical emergencies. There are many things that you can do to help prevent heart disease and stroke:  Have your blood pressure checked at least every 1-2 years. High blood pressure causes heart disease and increases the risk of stroke.  If you are 60-35 years old, ask your health care provider if you should take aspirin to prevent a heart attack or a stroke.  Do not use any tobacco products, including cigarettes, chewing tobacco, or electronic cigarettes. If you need help quitting, ask your health care provider.  It is important to eat a healthy diet and maintain a healthy weight. ? Be sure to include plenty of vegetables, fruits, low-fat dairy products, and lean protein. ? Avoid eating foods that are high in solid fats, added sugars, or salt (sodium).  Get regular exercise. This is one of the most important things that you can do for your health. ? Try to exercise for at least 150 minutes each week. The type of exercise that you do should increase your heart rate and make you sweat. This is known as moderate-intensity exercise. ? Try to do strengthening exercises at least twice each week. Do these in addition to the moderate-intensity exercise.  Know your numbers.Ask your health care provider to check your cholesterol and your blood glucose. Continue to have your blood tested as directed by your health care provider.  What should I know about cancer screening? There are  several types of cancer. Take the following steps to reduce your risk and to catch any cancer development as early as possible. Breast Cancer  Practice breast self-awareness. ? This means understanding how your breasts normally appear  and feel. ? It also means doing regular breast self-exams. Let your health care provider know about any changes, no matter how small.  If you are 37 or older, have a clinician do a breast exam (clinical breast exam or CBE) every year. Depending on your age, family history, and medical history, it may be recommended that you also have a yearly breast X-ray (mammogram).  If you have a family history of breast cancer, talk with your health care provider about genetic screening.  If you are at high risk for breast cancer, talk with your health care provider about having an MRI and a mammogram every year.  Breast cancer (BRCA) gene test is recommended for women who have family members with BRCA-related cancers. Results of the assessment will determine the need for genetic counseling and BRCA1 and for BRCA2 testing. BRCA-related cancers include these types: ? Breast. This occurs in males or females. ? Ovarian. ? Tubal. This may also be called fallopian tube cancer. ? Cancer of the abdominal or pelvic lining (peritoneal cancer). ? Prostate. ? Pancreatic.  Cervical, Uterine, and Ovarian Cancer Your health care provider may recommend that you be screened regularly for cancer of the pelvic organs. These include your ovaries, uterus, and vagina. This screening involves a pelvic exam, which includes checking for microscopic changes to the surface of your cervix (Pap test).  For women ages 21-65, health care providers may recommend a pelvic exam and a Pap test every three years. For women ages 53-65, they may recommend the Pap test and pelvic exam, combined with testing for human papilloma virus (HPV), every five years. Some types of HPV increase your risk of cervical cancer. Testing for HPV may also be done on women of any age who have unclear Pap test results.  Other health care providers may not recommend any screening for nonpregnant women who are considered low risk for pelvic cancer and have no  symptoms. Ask your health care provider if a screening pelvic exam is right for you.  If you have had past treatment for cervical cancer or a condition that could lead to cancer, you need Pap tests and screening for cancer for at least 20 years after your treatment. If Pap tests have been discontinued for you, your risk factors (such as having a new sexual partner) need to be reassessed to determine if you should start having screenings again. Some women have medical problems that increase the chance of getting cervical cancer. In these cases, your health care provider may recommend that you have screening and Pap tests more often.  If you have a family history of uterine cancer or ovarian cancer, talk with your health care provider about genetic screening.  If you have vaginal bleeding after reaching menopause, tell your health care provider.  There are currently no reliable tests available to screen for ovarian cancer.  Lung Cancer Lung cancer screening is recommended for adults 8-71 years old who are at high risk for lung cancer because of a history of smoking. A yearly low-dose CT scan of the lungs is recommended if you:  Currently smoke.  Have a history of at least 30 pack-years of smoking and you currently smoke or have quit within the past 15 years. A pack-year is smoking an average of  one pack of cigarettes per day for one year.  Yearly screening should:  Continue until it has been 15 years since you quit.  Stop if you develop a health problem that would prevent you from having lung cancer treatment.  Colorectal Cancer  This type of cancer can be detected and can often be prevented.  Routine colorectal cancer screening usually begins at age 70 and continues through age 71.  If you have risk factors for colon cancer, your health care provider may recommend that you be screened at an earlier age.  If you have a family history of colorectal cancer, talk with your health care  provider about genetic screening.  Your health care provider may also recommend using home test kits to check for hidden blood in your stool.  A small camera at the end of a tube can be used to examine your colon directly (sigmoidoscopy or colonoscopy). This is done to check for the earliest forms of colorectal cancer.  Direct examination of the colon should be repeated every 5-10 years until age 64. However, if early forms of precancerous polyps or small growths are found or if you have a family history or genetic risk for colorectal cancer, you may need to be screened more often.  Skin Cancer  Check your skin from head to toe regularly.  Monitor any moles. Be sure to tell your health care provider: ? About any new moles or changes in moles, especially if there is a change in a mole's shape or color. ? If you have a mole that is larger than the size of a pencil eraser.  If any of your family members has a history of skin cancer, especially at a young age, talk with your health care provider about genetic screening.  Always use sunscreen. Apply sunscreen liberally and repeatedly throughout the day.  Whenever you are outside, protect yourself by wearing long sleeves, pants, a wide-brimmed hat, and sunglasses.  What should I know about osteoporosis? Osteoporosis is a condition in which bone destruction happens more quickly than new bone creation. After menopause, you may be at an increased risk for osteoporosis. To help prevent osteoporosis or the bone fractures that can happen because of osteoporosis, the following is recommended:  If you are 60-26 years old, get at least 1,000 mg of calcium and at least 600 mg of vitamin D per day.  If you are older than age 48 but younger than age 39, get at least 1,200 mg of calcium and at least 600 mg of vitamin D per day.  If you are older than age 40, get at least 1,200 mg of calcium and at least 800 mg of vitamin D per day.  Smoking and excessive  alcohol intake increase the risk of osteoporosis. Eat foods that are rich in calcium and vitamin D, and do weight-bearing exercises several times each week as directed by your health care provider. What should I know about how menopause affects my mental health? Depression may occur at any age, but it is more common as you become older. Common symptoms of depression include:  Low or sad mood.  Changes in sleep patterns.  Changes in appetite or eating patterns.  Feeling an overall lack of motivation or enjoyment of activities that you previously enjoyed.  Frequent crying spells.  Talk with your health care provider if you think that you are experiencing depression. What should I know about immunizations? It is important that you get and maintain your immunizations. These include:  Tetanus, diphtheria, and pertussis (Tdap) booster vaccine.  Influenza every year before the flu season begins.  Pneumonia vaccine.  Shingles vaccine.  Your health care provider may also recommend other immunizations. This information is not intended to replace advice given to you by your health care provider. Make sure you discuss any questions you have with your health care provider. Document Released: 12/11/2005 Document Revised: 05/08/2016 Document Reviewed: 07/23/2015 Elsevier Interactive Patient Education  2018 [employer] Inc[employer]  

## 2018-09-08 NOTE — Progress Notes (Addendum)
Patient ID: [name], female    DOB: July 11, 1961  Age: 57 y.o. MRN: [id number]  The patient is here for annual  Preventive examination and management of other chronic and acute problems.   S/p tah bso Colon 2014  5 yr follow up due now    The risk factors are reflected in the social history.  The roster of all physicians providing medical care to patient - is listed in the Snapshot section of the chart.  Activities of daily living:  The patient is 100% independent in all ADLs: dressing, toileting, feeding as well as independent mobility  Home safety : The patient has smoke detectors in the home. They wear seatbelts.  There are no firearms at home. There is no violence in the home.   There is no risks for hepatitis, STDs or HIV. There is no   history of blood transfusion. They have no travel history to infectious disease endemic areas of the world.  The patient has seen their dentist in the last six month. They have seen their eye doctor in the last year  She wears contacts.  . They deny  hearing difficulty with regard to whispered voices and some television programs.  They have deferred audiologic testing in the last year.  They do not  have excessive sun exposure. Discussed the need for sun protection: hats, long sleeves and use of sunscreen if there is significant sun exposure.   Diet: the importance of a healthy diet is discussed. They do have a healthy diet.  The benefits of regular aerobic exercise were discussed. She is not exercising regularly and has gained weight .   Depression screen: there are no signs or vegative symptoms of depression- irritability, change in appetite, anhedonia, sadness/tearfullness.   The following portions of the patient's history were reviewed and updated as appropriate: allergies, current medications, past family history, past medical history,  past surgical history, past social history  and problem list.  Visual acuity was not assessed per patient  preference since she has regular follow up with her ophthalmologist. Hearing and body mass index were assessed and reviewed.   During the course of the visit the patient was educated and counseled about appropriate screening and preventive services including : fall prevention , diabetes screening, nutrition counseling, colorectal cancer screening, and recommended immunizations.    CC: The primary encounter diagnosis was Encounter for preventive health examination. Diagnoses of Dysplastic nevi, Family history of colon cancer requiring screening colonoscopy, Hyperlipidemia LDL goal <130, Fatigue, unspecified type, Thyroid nodule, Abdominal mass, left upper quadrant, Breast cancer screening, Lipoma of left lower extremity, Overweight (BMI 25.0-29.9), Cystic thyroid nodule, Abdominal mass, LUQ (left upper quadrant), and Hyperlipidemia with target LDL less than 160 were also pertinent to this visit.  1) weight gain.   2) LUQ mass .  Feels it when she bears down .     History [name] has a past medical history of DDD (degenerative disc disease), cervical, History of colon polyps, History of endometriosis, History of palpitations, History of uterine leiomyoma, Irritable bowel syndrome (IBS), Lipoma of thigh, OA (osteoarthritis), PONV (postoperative nausea and vomiting), Prolapsed hemorrhoids, Solitary right kidney (2011), Trochanteric bursitis of left hip, Vitamin D deficiency, and Wears contact lenses.   She has a past surgical history that includes Nephrectomy living donor (Left, 2011); Laparoscopic vaginal hysterectomy with salpingo oophorectomy (Bilateral, 04-05-2008  dr Phineas Real at [facility]); Colonoscopy (last one 09-18-2013); Tonsillectomy (age 50); DX LAPAROSCOPY W/ FULGERATION ENDOMETRIOSIS (1993;  1995); Dilation and curettage  of uterus (1993); and Hemorrhoid surgery (N/A, 06/04/2017).   Her family history includes Cancer in her paternal aunt and sister; Cancer (age of onset: 71) in her sister; Cancer (age  of onset: 1) in her sister; Hearing loss in her daughter; Mental illness (age of onset: 68) in her father.She reports that she has never smoked. She has never used smokeless tobacco. She reports that she drinks about 2.0 standard drinks of alcohol per week. She reports that she does not use drugs.  Outpatient Medications Prior to Visit  Medication Sig Dispense Refill  . Cholecalciferol (VITAMIN D-3) 5000 UNIT/ML LIQD Place 5,000 mg under the tongue daily.    Marland Kitchen estradiol (VIVELLE-DOT) 0.1 MG/24HR patch PLACE 1 PATCH ONTO THE SKIN 2 TIMES A WEEK 8 patch 11  . Magnesium 400 MG TABS Take 1 tablet by mouth 2 (two) times daily.    Marland Kitchen PARoxetine (PAXIL) 20 MG tablet Take 1 tablet (20 mg total) by mouth every morning. 90 tablet 1  . Potassium 99 MG TABS Take 1 tablet by mouth 2 (two) times daily.      No facility-administered medications prior to visit.     Review of Systems   Patient denies headache, fevers, malaise, unintentional weight loss, skin rash, eye pain, sinus congestion and sinus pain, sore throat, dysphagia,  hemoptysis , cough, dyspnea, wheezing, chest pain, palpitations, orthopnea, edema, abdominal pain, nausea, melena, diarrhea, constipation, flank pain, dysuria, hematuria, urinary  Frequency, nocturia, numbness, tingling, seizures,  Focal weakness, Loss of consciousness,  Tremor, insomnia, depression, anxiety, and suicidal ideation.      Objective:  BP 136/80 (BP Location: Left Arm, Patient Position: Sitting, Cuff Size: Normal)   Pulse 75   Temp 98.4 F (36.9 C) (Oral)   Resp 14   Ht 5\' 3"  (1.6 m)   Wt 155 lb (70.3 kg)   SpO2 96%   BMI 27.46 kg/m   Physical Exam   General appearance: alert, cooperative and appears stated age Head: Normocephalic, without obvious abnormality, atraumatic Eyes: conjunctivae/corneas clear. PERRL, EOM's intact. Fundi benign. Ears: normal TM's and external ear canals both ears Nose: Nares normal. Septum midline. Mucosa normal. No drainage or  sinus tenderness. Throat: lips, mucosa, and tongue normal; teeth and gums normal Neck: no adenopathy, no carotid bruit, no JVD, supple, symmetrical, trachea midline and thyroid not enlarged, symmetric, no tenderness/mass/nodules Lungs: clear to auscultation bilaterally Breasts: normal appearance, no masses or tenderness Heart: regular rate and rhythm, S1, S2 normal, no murmur, click, rub or gallop Abdomen: soft, non-tender; bowel sounds normal; no masses,  no organomegaly Extremities: extremities normal, atraumatic, no cyanosis or edema Pulses: 2+ and symmetric Skin: Skin color, texture, turgor normal. No rashes or lesions Neurologic: Alert and oriented X 3, normal strength and tone. Normal symmetric reflexes. Normal coordination and gait.      Assessment & Plan:   Problem List Items Addressed This Visit    Abdominal mass, LUQ (left upper quadrant)    Incisional  hernia suspected given the proximity to her laparoscopy scars from her nephrectomy .  Abdominal ultrasound ordered       Cystic thyroid nodule    Right lobe  Cystic by ultrasound .  Did not meet criteria for biopsy or follow up      Encounter for preventive health examination - Primary    Annual comprehensive preventive exam was done as well as an evaluation and management of chronic conditions .  During the course of the visit the patient was educated and  counseled about appropriate screening and preventive services including :  diabetes screening, lipid analysis with projected  10 year  risk for CAD , nutrition counseling, breast, cervical and colorectal cancer screening, and recommended immunizations.  Printed recommendations for health maintenance screenings was given      Hyperlipidemia with target LDL less than 160    Your 10 yr risk for Coronary artery disease (using the  [place] risk calculator tool)  Is  8% No treatment is needed,  But We should repeat it annually .  Lab Results  Component Value Date   CHOL 265  (H) 09/08/2018   HDL 77.40 09/08/2018   LDLCALC 165 (H) 09/08/2018   TRIG 110.0 09/08/2018   CHOLHDL 3 09/08/2018         Lipoma of lower extremity    Multiple lipomas were resected in January form both thighs without complications      Overweight (BMI 25.0-29.9)    I have addressed  BMI and recommended a low glycemic index diet utilizing smaller more frequent meals to increase metabolism.  I have also recommended that patient start exercising with a goal of 30 minutes of aerobic exercise a minimum of 5 days per week. Screening for lipid disorders, thyroid and diabetes to be done today.  Lab Results  Component Value Date   TSH 1.42 09/08/2018   Lab Results  Component Value Date   HGBA1C 5.1 09/06/2017   Lab Results  Component Value Date   CHOL 265 (H) 09/08/2018   HDL 77.40 09/08/2018   LDLCALC 165 (H) 09/08/2018   TRIG 110.0 09/08/2018   CHOLHDL 3 09/08/2018          Other Visit Diagnoses    Dysplastic nevi       Relevant Orders   Ambulatory referral to Dermatology   Family history of colon cancer requiring screening colonoscopy       Relevant Orders   Ambulatory referral to Gastroenterology   Hyperlipidemia LDL goal <130       Relevant Orders   Lipid panel (Completed)   Fatigue, unspecified type       Relevant Orders   Comprehensive metabolic panel (Completed)   Thyroid nodule       Relevant Orders   TSH (Completed)   Abdominal mass, left upper quadrant       Relevant Orders   US Abdomen Complete   Breast cancer screening       Relevant Orders   MM 3D SCREEN BREAST BILATERAL      I am having Sugar K. Keech start on traMADol. I am also having her maintain her Potassium, Magnesium, PARoxetine, estradiol, and Vitamin D-3.  Meds ordered this encounter  Medications  . traMADol (ULTRAM) 50 MG tablet    Sig: Take 1 tablet (50 mg total) by mouth every 8 (eight) hours as needed.    Dispense:  30 tablet    Refill:  0    There are no discontinued  medications.  Follow-up: No follow-ups on file.   Crecencio Mc, MD

## 2018-09-11 DIAGNOSIS — E785 Hyperlipidemia, unspecified: Secondary | ICD-10-CM | POA: Insufficient documentation

## 2018-09-11 DIAGNOSIS — R1902 Left upper quadrant abdominal swelling, mass and lump: Secondary | ICD-10-CM | POA: Insufficient documentation

## 2018-09-11 NOTE — Assessment & Plan Note (Signed)
Incisional  hernia suspected given the proximity to her laparoscopy scars from her nephrectomy .  Abdominal ultrasound ordered

## 2018-09-11 NOTE — Assessment & Plan Note (Signed)
I have addressed  BMI and recommended a low glycemic index diet utilizing smaller more frequent meals to increase metabolism.  I have also recommended that patient start exercising with a goal of 30 minutes of aerobic exercise a minimum of 5 days per week. Screening for lipid disorders, thyroid and diabetes to be done today.  Lab Results  Component Value Date   TSH 1.42 09/08/2018   Lab Results  Component Value Date   HGBA1C 5.1 09/06/2017   Lab Results  Component Value Date   CHOL 265 (H) 09/08/2018   HDL 77.40 09/08/2018   LDLCALC 165 (H) 09/08/2018   TRIG 110.0 09/08/2018   CHOLHDL 3 09/08/2018

## 2018-09-11 NOTE — Assessment & Plan Note (Addendum)
Multiple lipomas were resected in January form both thighs without complications

## 2018-09-11 NOTE — Assessment & Plan Note (Signed)
Your 10 yr risk for Coronary artery disease (using the  [place] risk calculator tool)  Is  8% No treatment is needed,  But We should repeat it annually .  Lab Results  Component Value Date   CHOL 265 (H) 09/08/2018   HDL 77.40 09/08/2018   LDLCALC 165 (H) 09/08/2018   TRIG 110.0 09/08/2018   CHOLHDL 3 09/08/2018

## 2018-09-11 NOTE — Assessment & Plan Note (Signed)
Annual comprehensive preventive exam was done as well as an evaluation and management of chronic conditions .  During the course of the visit the patient was educated and counseled about appropriate screening and preventive services including :  diabetes screening, lipid analysis with projected  10 year  risk for CAD , nutrition counseling, breast, cervical and colorectal cancer screening, and recommended immunizations.  Printed recommendations for health maintenance screenings was given 

## 2018-09-11 NOTE — Assessment & Plan Note (Signed)
Right lobe  Cystic by ultrasound .  Did not meet criteria for biopsy or follow up

## 2018-09-14 ENCOUNTER — Other Ambulatory Visit: Payer: Self-pay

## 2018-09-19 ENCOUNTER — Ambulatory Visit: Payer: 59

## 2018-09-22 DIAGNOSIS — H524 Presbyopia: Secondary | ICD-10-CM | POA: Diagnosis not present

## 2018-09-23 ENCOUNTER — Telehealth: Payer: Self-pay

## 2018-09-23 ENCOUNTER — Other Ambulatory Visit: Payer: Self-pay | Admitting: Internal Medicine

## 2018-09-23 DIAGNOSIS — R1902 Left upper quadrant abdominal swelling, mass and lump: Secondary | ICD-10-CM

## 2018-09-23 NOTE — Telephone Encounter (Signed)
Copied from [place]. Topic: Referral - Status >> Sep 23, 2018 11:23 AM Scherrie Gerlach wrote: Reason for CRM: [facility] [place] dept calling to ask about the referral Dr Derrel Nip put in. If she is just concerned for a hernia, they can change to an abdomen limited. What is ordered now will only look at the upper abdomen organs Questions?  call back  Almyra Free..[phone] Or change the order  Pt is scheduled for Monday, 11/25

## 2018-09-23 NOTE — Telephone Encounter (Signed)
I am looking to evaluate a LUQ mass that is close to her laparascopy scars .  I think it may  Be an incisional hernia.  Whatever ultrasound will get me this information  Is what I want.

## 2018-09-26 ENCOUNTER — Ambulatory Visit
Admission: RE | Admit: 2018-09-26 | Discharge: 2018-09-26 | Disposition: A | Discharge status: Home / Self Care | Payer: 59 | Source: Ambulatory Visit | Attending: Internal Medicine | Admitting: Internal Medicine

## 2018-09-26 DIAGNOSIS — R1902 Left upper quadrant abdominal swelling, mass and lump: Secondary | ICD-10-CM | POA: Diagnosis not present

## 2018-09-26 DIAGNOSIS — R1900 Intra-abdominal and pelvic swelling, mass and lump, unspecified site: Secondary | ICD-10-CM | POA: Diagnosis not present

## 2018-09-29 ENCOUNTER — Other Ambulatory Visit: Payer: Self-pay | Admitting: Internal Medicine

## 2018-09-29 DIAGNOSIS — R1900 Intra-abdominal and pelvic swelling, mass and lump, unspecified site: Secondary | ICD-10-CM

## 2018-10-14 ENCOUNTER — Telehealth: Payer: Self-pay

## 2018-10-14 DIAGNOSIS — R1900 Intra-abdominal and pelvic swelling, mass and lump, unspecified site: Secondary | ICD-10-CM

## 2018-10-14 NOTE — Telephone Encounter (Signed)
CT has been changed to CT abdomen pelvis with contrast.

## 2018-10-14 NOTE — Telephone Encounter (Signed)
Copied from [place] [id number]. Topic: General - Inquiry >> Oct 14, 2018 11:00 AM Vernona Rieger wrote: Reason for CRM: [name] with [facility] scheduling called and said that the patient has a CT on Monday, December 16th but the patient is going to have to reschedule. She needs the order changed to " abdomen pelvis with contrast " instead of without contrast. Please Advise.

## 2018-10-14 NOTE — Addendum Note (Signed)
Addended by: Adair Laundry on: 10/14/2018 11:39 AM   Modules accepted: Orders

## 2018-10-17 ENCOUNTER — Ambulatory Visit: Admission: RE | Admit: 2018-10-17 | Payer: 59 | Source: Ambulatory Visit

## 2018-10-20 ENCOUNTER — Ambulatory Visit: Admission: RE | Admit: 2018-10-20 | Payer: 59 | Source: Ambulatory Visit

## 2018-11-03 ENCOUNTER — Telehealth: Payer: Self-pay

## 2018-11-03 NOTE — Telephone Encounter (Signed)
Copied from [place] [id number]. Topic: General - Other >> Nov 03, 2018  2:07 PM Virl Axe D wrote: Reason for CRM: [employer] stated that pt needs a prior authorization for CT Scan or she will have to pay $500. Please advise. CB# [phone]

## 2018-11-07 ENCOUNTER — Encounter: Payer: Self-pay | Admitting: Internal Medicine

## 2018-11-07 NOTE — Telephone Encounter (Signed)
We do not have her [employer]. She will need to give [place] that information and then the prior authorization can be obtained.

## 2018-12-03 DIAGNOSIS — K439 Ventral hernia without obstruction or gangrene: Secondary | ICD-10-CM

## 2018-12-03 HISTORY — DX: Ventral hernia without obstruction or gangrene: K43.9

## 2018-12-08 ENCOUNTER — Encounter: Payer: Self-pay | Admitting: Internal Medicine

## 2018-12-08 ENCOUNTER — Ambulatory Visit
Admission: RE | Admit: 2018-12-08 | Discharge: 2018-12-08 | Disposition: A | Discharge status: Home / Self Care | Payer: No Typology Code available for payment source | Source: Ambulatory Visit | Attending: Internal Medicine | Admitting: Internal Medicine

## 2018-12-08 DIAGNOSIS — K760 Fatty (change of) liver, not elsewhere classified: Secondary | ICD-10-CM | POA: Insufficient documentation

## 2018-12-08 DIAGNOSIS — R1900 Intra-abdominal and pelvic swelling, mass and lump, unspecified site: Secondary | ICD-10-CM | POA: Diagnosis present

## 2018-12-08 HISTORY — DX: Ventral hernia without obstruction or gangrene: K43.9

## 2018-12-08 MED ORDER — IOPAMIDOL (ISOVUE-300) INJECTION 61%
85.0000 mL | Freq: Once | INTRAVENOUS | Status: AC | PRN
  Administered 2018-12-08: 85 mL via INTRAVENOUS

## 2018-12-13 ENCOUNTER — Ambulatory Visit
Admission: RE | Admit: 2018-12-13 | Discharge: 2018-12-13 | Disposition: A | Discharge status: Home / Self Care | Payer: No Typology Code available for payment source | Source: Ambulatory Visit | Attending: Internal Medicine | Admitting: Internal Medicine

## 2018-12-13 DIAGNOSIS — Z1239 Encounter for other screening for malignant neoplasm of breast: Secondary | ICD-10-CM | POA: Diagnosis not present

## 2019-05-10 ENCOUNTER — Other Ambulatory Visit: Payer: Self-pay | Admitting: Internal Medicine

## 2019-05-10 NOTE — Telephone Encounter (Signed)
Refilled: 09/08/2018 Last OV: 09/08/2018 Next OV: 09/14/2019

## 2019-08-28 ENCOUNTER — Other Ambulatory Visit: Payer: Self-pay | Admitting: Internal Medicine

## 2019-09-14 ENCOUNTER — Encounter: Payer: 59 | Admitting: Internal Medicine

## 2019-09-15 ENCOUNTER — Telehealth: Payer: Self-pay | Admitting: *Deleted

## 2019-09-15 DIAGNOSIS — Z800 Family history of malignant neoplasm of digestive organs: Secondary | ICD-10-CM

## 2019-09-15 NOTE — Telephone Encounter (Signed)
Copied from [place] [id number]. Topic: Referral - Request for Referral >> Sep 15, 2019  2:53 PM Yvette Rack wrote: Has patient seen PCP for this complaint? yes *If NO, is insurance requiring patient see PCP for this issue before PCP can refer them? Referral for which specialty: GI Preferred provider/office: Dr. Vicente Males at [facility] Reason for referral: colonoscopy - Pt has [employer] and needs referral

## 2019-09-15 NOTE — Telephone Encounter (Signed)
No new symptoms wants for colonoscopy

## 2019-09-18 ENCOUNTER — Other Ambulatory Visit: Payer: Self-pay

## 2019-09-18 DIAGNOSIS — Z1211 Encounter for screening for malignant neoplasm of colon: Secondary | ICD-10-CM

## 2019-09-21 ENCOUNTER — Other Ambulatory Visit
Admission: RE | Admit: 2019-09-21 | Discharge: 2019-09-21 | Disposition: A | Discharge status: Home / Self Care | Payer: No Typology Code available for payment source | Source: Ambulatory Visit | Attending: Gastroenterology | Admitting: Gastroenterology

## 2019-09-21 DIAGNOSIS — Z01812 Encounter for preprocedural laboratory examination: Secondary | ICD-10-CM | POA: Diagnosis present

## 2019-09-21 DIAGNOSIS — Z20828 Contact with and (suspected) exposure to other viral communicable diseases: Secondary | ICD-10-CM | POA: Insufficient documentation

## 2019-09-21 LAB — SARS CORONAVIRUS 2 (TAT 6-24 HRS): SARS Coronavirus 2: NEGATIVE

## 2019-09-25 ENCOUNTER — Ambulatory Visit
Admission: RE | Admit: 2019-09-25 | Discharge: 2019-09-25 | Disposition: A | Discharge status: Home / Self Care | Payer: No Typology Code available for payment source | Attending: Gastroenterology | Admitting: Gastroenterology

## 2019-09-25 ENCOUNTER — Other Ambulatory Visit: Payer: Self-pay

## 2019-09-25 ENCOUNTER — Ambulatory Visit: Payer: No Typology Code available for payment source | Admitting: Anesthesiology

## 2019-09-25 ENCOUNTER — Encounter
Admission: RE | Disposition: A | Discharge status: Home / Self Care | Payer: Self-pay | Source: Home / Self Care | Attending: Gastroenterology

## 2019-09-25 DIAGNOSIS — Z79899 Other long term (current) drug therapy: Secondary | ICD-10-CM | POA: Insufficient documentation

## 2019-09-25 DIAGNOSIS — Z1211 Encounter for screening for malignant neoplasm of colon: Secondary | ICD-10-CM | POA: Insufficient documentation

## 2019-09-25 DIAGNOSIS — M1990 Unspecified osteoarthritis, unspecified site: Secondary | ICD-10-CM | POA: Diagnosis not present

## 2019-09-25 DIAGNOSIS — E559 Vitamin D deficiency, unspecified: Secondary | ICD-10-CM | POA: Diagnosis not present

## 2019-09-25 DIAGNOSIS — D122 Benign neoplasm of ascending colon: Secondary | ICD-10-CM | POA: Insufficient documentation

## 2019-09-25 DIAGNOSIS — K635 Polyp of colon: Secondary | ICD-10-CM | POA: Diagnosis not present

## 2019-09-25 DIAGNOSIS — Z905 Acquired absence of kidney: Secondary | ICD-10-CM | POA: Diagnosis not present

## 2019-09-25 HISTORY — PX: COLONOSCOPY WITH PROPOFOL: SHX5780

## 2019-09-25 SURGERY — COLONOSCOPY WITH PROPOFOL
Anesthesia: General

## 2019-09-25 MED ORDER — SODIUM CHLORIDE 0.9 % IV SOLN: INTRAVENOUS | Status: DC

## 2019-09-25 MED ORDER — SODIUM CHLORIDE 0.9 % IV SOLN
INTRAVENOUS | Status: DC
  Administered 2019-09-25: 1000 mL via INTRAVENOUS

## 2019-09-25 MED ORDER — PROPOFOL 10 MG/ML IV BOLUS
INTRAVENOUS | Status: DC | PRN
  Administered 2019-09-25: 20 mg via INTRAVENOUS
  Administered 2019-09-25: 30 mg via INTRAVENOUS
  Administered 2019-09-25: 80 mg via INTRAVENOUS
  Administered 2019-09-25 (×2): 30 mg via INTRAVENOUS
  Administered 2019-09-25: 20 mg via INTRAVENOUS

## 2019-09-25 MED ORDER — PROPOFOL 500 MG/50ML IV EMUL
INTRAVENOUS | Status: DC | PRN
  Administered 2019-09-25: 140 ug/kg/min via INTRAVENOUS

## 2019-09-25 MED ORDER — LIDOCAINE HCL (CARDIAC) PF 100 MG/5ML IV SOSY
PREFILLED_SYRINGE | INTRAVENOUS | Status: DC | PRN
  Administered 2019-09-25: 100 mg via INTRAVENOUS

## 2019-09-25 NOTE — Anesthesia Post-op Follow-up Note (Signed)
Anesthesia QCDR form completed.        

## 2019-09-25 NOTE — Op Note (Signed)
[facility]astro[facility]ology Patient Name: [name] Procedure Date: 09/25/2019 10:05 AM MRN: [id number] Account #: [id number] Date of Birth: June 02, 1961 Admit Type: Emergency Department Age: 58 Room: [facility] ENDO ROOM 4 Gender: Female Note Status: Finalized Procedure:             Colonoscopy Indications:           Screening for colorectal malignant neoplasm Providers:             Jonathon Bellows MD, MD Referring MD:          Deborra Medina, MD (Referring MD) Medicines:             Monitored Anesthesia Care Complications:         No immediate complications. Procedure:             Pre-Anesthesia Assessment:                        - Prior to the procedure, a History and Physical was                         performed, and patient medications, allergies and                         sensitivities were reviewed. The patient's tolerance                         of previous anesthesia was reviewed.                        - The risks and benefits of the procedure and the                         sedation options and risks were discussed with the                         patient. All questions were answered and informed                         consent was obtained.                        - ASA Grade Assessment: II - A patient with mild                         systemic disease.                        After obtaining informed consent, the colonoscope was                         passed under direct vision. Throughout the procedure,                         the patient's blood pressure, pulse, and oxygen                         saturations were monitored continuously. The                         Colonoscope was introduced through the anus  and                         advanced to the the cecum, identified by the                         appendiceal orifice. The colonoscopy was performed                         with ease. The patient tolerated the procedure well.                         The  quality of the bowel preparation was good. Findings:      The perianal and digital rectal examinations were normal.      A 5 mm polyp was found in the ascending colon. The polyp was sessile.       The polyp was removed with a cold biopsy forceps. Resection and       retrieval were complete.      The exam was otherwise without abnormality on direct and retroflexion       views. Impression:            - One 5 mm polyp in the ascending colon, removed with                         a cold biopsy forceps. Resected and retrieved.                        - The examination was otherwise normal on direct and                         retroflexion views. Recommendation:        - Discharge patient to home (with escort).                        - Resume previous diet.                        - Continue present medications.                        - Await pathology results.                        - Repeat colonoscopy for surveillance based on                         pathology results. Procedure Code(s):     --- Professional ---                        [id number], Colonoscopy, flexible; with biopsy, single or                         multiple Diagnosis Code(s):     --- Professional ---                        Z12.11, Encounter for screening for malignant neoplasm  of colon                        K63.5, Polyp of colon CPT copyright 2019 American Medical Association. All rights reserved. The codes documented in this report are preliminary and upon coder review may  be revised to meet current compliance requirements. Jonathon Bellows, MD Jonathon Bellows MD, MD 09/25/2019 10:48:27 AM This report has been signed electronically. Number of Addenda: 0 Note Initiated On: 09/25/2019 10:05 AM Scope Withdrawal Time: 0 hours 18 minutes 38 seconds  Total Procedure Duration: 0 hours 24 minutes 57 seconds  Estimated Blood Loss:  Estimated blood loss: none.      [facility]

## 2019-09-25 NOTE — H&P (Signed)
Jonathon Bellows, MD [address], [place], [place], [place], [address] [address], [place], [place], [place], [address] Phone: [phone]  Fax: [phone]  Primary Care Physician:  Crecencio Mc, MD   Pre-Procedure History & Physical: HPI:  [name] is a 58 y.o. female is here for an colonoscopy.   Past Medical History:  Diagnosis Date  . DDD (degenerative disc disease), cervical   . History of colon polyps   . History of endometriosis   . History of palpitations   . History of uterine leiomyoma   . Irritable bowel syndrome (IBS)   . Lipoma of thigh    left  . OA (osteoarthritis)    knees  . PONV (postoperative nausea and vomiting)    severe/// per pt becomes hypotensive post-operatively  . Prolapsed hemorrhoids   . Solitary right kidney 2011   pt donated left kidney to family member   . 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 Trochanteric bursitis of left hip   . Vitamin D deficiency   . Wears contact lenses     Past Surgical History:  Procedure Laterality Date  . COLONOSCOPY  last one 09-18-2013  . DILATION AND CURETTAGE OF UTERUS  1993   w/  suction for missed ab  . DX LAPAROSCOPY W/ Bailey ENDOMETRIOSIS  1993;  1995  . HEMORRHOID SURGERY N/A 06/04/2017   Procedure: HEMORRHOIDECTOMY single column and HEMORRHOIDOPEXY;  Surgeon: Leighton Ruff, MD;  Location: [facility];  Service: General;  Laterality: N/A;  . LAPAROSCOPIC VAGINAL HYSTERECTOMY WITH SALPINGO OOPHORECTOMY Bilateral 04-05-2008  dr Phineas Real at [facility]   w/  Fulgeration endometriosis  . NEPHRECTOMY LIVING DONOR Left 2011   to family member  . TONSILLECTOMY  age 35    Prior to Admission medications   Medication Sig Start Date End Date Taking? Authorizing Provider  Cholecalciferol (VITAMIN D-3) 5000 UNIT/ML LIQD Place 5,000 mg under the tongue daily.   Yes [provider]  estradiol (VIVELLE-DOT) 0.1 MG/24HR patch PLACE 1 PATCH ONTO THE SKIN 2 TIMES A WEEK  08/28/19  Yes Crecencio Mc, MD  Magnesium 400 MG TABS Take 1 tablet by mouth 2 (two) times daily.   Yes [provider]  PARoxetine (PAXIL) 20 MG tablet Take 1 tablet (20 mg total) by mouth every morning. 08/28/19  Yes Crecencio Mc, MD  Potassium 99 MG TABS Take 1 tablet by mouth 2 (two) times daily.    Yes [provider]  traMADol (ULTRAM) 50 MG tablet TAKE 1 TABLET BY MOUTH EVERY 8 HOURS AS NEEDED. 05/10/19  Yes Crecencio Mc, MD    Allergies as of 09/18/2019  . (No Known Allergies)    Family History  Problem Relation Age of Onset  . Mental illness Father 45       bipolar, committed suicide  . Cancer Sister 37       endometrial, thyroid  . Cancer Paternal Aunt        breast ca  . Breast cancer Paternal Aunt   . Cancer Sister        thyroid ca  . Cancer Sister 41       melanoma  . Hearing loss Daughter   . Breast cancer Cousin        1st paternal    Social History   Socioeconomic History  . Marital status: Married    Spouse name: Not on file  . Number of children: Not on  file  . Years of education: Not on file  . Highest education level: Not on file  Occupational History  . Not on file  Social Needs  . Financial resource strain: Not on file  . Food insecurity    Worry: Not on file    Inability: Not on file  . Transportation needs    Medical: Not on file    Non-medical: Not on file  Tobacco Use  . Smoking status: Never Smoker  . Smokeless tobacco: Never Used  Substance and Sexual Activity  . Alcohol use: Yes    Alcohol/week: 2.0 standard drinks    Types: 2 Shots of liquor per week  . Drug use: No  . Sexual activity: Yes    Birth control/protection: Surgical  Lifestyle  . Physical activity    Days per week: Not on file    Minutes per session: Not on file  . Stress: Not on file  Relationships  . Social Herbalist on phone: Not on file    Gets together: Not on file    Attends religious service: Not on file    Active  member of club or organization: Not on file    Attends meetings of clubs or organizations: Not on file    Relationship status: Not on file  . Intimate partner violence    Fear of current or ex partner: Not on file    Emotionally abused: Not on file    Physically abused: Not on file    Forced sexual activity: Not on file  Other Topics Concern  . Not on file  Social History Narrative  . Not on file    Review of Systems: See HPI, otherwise negative ROS  Physical Exam: BP (!) 143/77   Pulse 76   Temp (!) 96.8 F (36 C) (Tympanic)   Resp 16   Ht 5\' 3"  (1.6 m)   Wt 68 kg   SpO2 100%   BMI 26.57 kg/m  General:   Alert,  pleasant and cooperative in NAD Head:  Normocephalic and atraumatic. Neck:  Supple; no masses or thyromegaly. Lungs:  Clear throughout to auscultation, normal respiratory effort.    Heart:  +S1, +S2, Regular rate and rhythm, No edema. Abdomen:  Soft, nontender and nondistended. Normal bowel sounds, without guarding, and without rebound.   Neurologic:  Alert and  oriented x4;  grossly normal neurologically.  Impression/Plan: [name] is here for an colonoscopy to be performed for Screening colonoscopy average risk   Risks, benefits, limitations, and alternatives regarding  colonoscopy have been reviewed with the patient.  Questions have been answered.  All parties agreeable.   Jonathon Bellows, MD  09/25/2019, 10:02 AM

## 2019-09-25 NOTE — Transfer of Care (Signed)
Immediate Anesthesia Transfer of Care Note  Patient: [name]  Procedure(s) Performed: COLONOSCOPY WITH PROPOFOL (N/A )  Patient Location: PACU and Endoscopy Unit  Anesthesia Type:General  Level of Consciousness: awake, drowsy and patient cooperative  Airway & Oxygen Therapy: Patient Spontanous Breathing  Post-op Assessment: Report given to RN and Post -op Vital signs reviewed and stable  Post vital signs: Reviewed and stable  Last Vitals:  Vitals Value Taken Time  BP 95/63 09/25/19 1047  Temp 35.8 C 09/25/19 1047  Pulse 68 09/25/19 1048  Resp 13 09/25/19 1048  SpO2 98 % 09/25/19 1048  Vitals shown include unvalidated device data.  Last Pain:  Vitals:   09/25/19 1047  TempSrc: Temporal  PainSc:          Complications: No apparent anesthesia complications

## 2019-09-25 NOTE — Anesthesia Preprocedure Evaluation (Signed)
Anesthesia Evaluation  Patient identified by MRN, date of birth, ID band Patient awake    Reviewed: Allergy & Precautions, NPO status , Patient's Chart, lab work & pertinent test results  History of Anesthesia Complications (+) PONV and history of anesthetic complications  Airway Mallampati: II  TM Distance: >3 FB Neck ROM: Full    Dental no notable dental hx.    Pulmonary neg pulmonary ROS, neg sleep apnea, neg COPD,    breath sounds clear to auscultation- rhonchi (-) wheezing      Cardiovascular Exercise Tolerance: Good (-) hypertension(-) CAD, (-) Past MI, (-) Cardiac Stents and (-) CABG  Rhythm:Regular Rate:Normal - Systolic murmurs and - Diastolic murmurs    Neuro/Psych neg Seizures negative neurological ROS  negative psych ROS   GI/Hepatic negative GI ROS, Neg liver ROS,   Endo/Other  negative endocrine ROSneg diabetes  Renal/GU negative Renal ROS     Musculoskeletal  (+) Arthritis ,   Abdominal (+) - obese,   Peds  Hematology negative hematology ROS (+)   Anesthesia Other Findings Past Medical History: No date: DDD (degenerative disc disease), cervical No date: History of colon polyps No date: History of endometriosis No date: History of palpitations No date: History of uterine leiomyoma No date: Irritable bowel syndrome (IBS) No date: Lipoma of thigh     Comment:  left No date: OA (osteoarthritis)     Comment:  knees No date: PONV (postoperative nausea and vomiting)     Comment:  severe/// per pt becomes hypotensive post-operatively No date: Prolapsed hemorrhoids 2011: Solitary right kidney     Comment:  pt donated left kidney to family member  12/2018: Spigelian hernia     Comment:  left side No date: Trochanteric bursitis of left hip No date: Vitamin D deficiency No date: Wears contact lenses   Reproductive/Obstetrics                             Anesthesia  Physical Anesthesia Plan  ASA: II  Anesthesia Plan: General   Post-op Pain Management:    Induction: Intravenous  PONV Risk Score and Plan: 3 and Propofol infusion  Airway Management Planned: Natural Airway  Additional Equipment:   Intra-op Plan:   Post-operative Plan:   Informed Consent: I have reviewed the patients History and Physical, chart, labs and discussed the procedure including the risks, benefits and alternatives for the proposed anesthesia with the patient or authorized representative who has indicated his/her understanding and acceptance.     Dental advisory given  Plan Discussed with: CRNA and Anesthesiologist  Anesthesia Plan Comments:         Anesthesia Quick Evaluation

## 2019-09-26 ENCOUNTER — Encounter: Payer: Self-pay | Admitting: Gastroenterology

## 2019-09-26 LAB — SURGICAL PATHOLOGY

## 2019-09-26 NOTE — Anesthesia Postprocedure Evaluation (Signed)
Anesthesia Post Note  Patient: [name]  Procedure(s) Performed: COLONOSCOPY WITH PROPOFOL (N/A )  Patient location during evaluation: Endoscopy Anesthesia Type: General Level of consciousness: awake and alert and oriented Pain management: pain level controlled Vital Signs Assessment: post-procedure vital signs reviewed and stable Respiratory status: spontaneous breathing, nonlabored ventilation and respiratory function stable Cardiovascular status: blood pressure returned to baseline and stable Postop Assessment: no signs of nausea or vomiting Anesthetic complications: no     Last Vitals:  Vitals:   09/25/19 1057 09/25/19 1117  BP: 112/79 (!) 143/93  Pulse:    Resp:    Temp:    SpO2:      Last Pain:  Vitals:   09/25/19 1117  TempSrc:   PainSc: 0-No pain                 Victoria Euceda

## 2019-09-26 NOTE — Progress Notes (Signed)
Inform the polyp was an adenoma - repeat colonoscopy in 7 years

## 2019-09-27 ENCOUNTER — Other Ambulatory Visit: Payer: Self-pay | Admitting: Internal Medicine

## 2019-09-27 DIAGNOSIS — U071 COVID-19: Secondary | ICD-10-CM

## 2019-09-27 HISTORY — DX: COVID-19: U07.1

## 2019-10-01 ENCOUNTER — Encounter: Payer: Self-pay | Admitting: Gastroenterology

## 2019-10-18 ENCOUNTER — Other Ambulatory Visit: Payer: Self-pay

## 2019-10-18 ENCOUNTER — Ambulatory Visit (INDEPENDENT_AMBULATORY_CARE_PROVIDER_SITE_OTHER): Payer: No Typology Code available for payment source | Admitting: Internal Medicine

## 2019-10-18 ENCOUNTER — Encounter: Payer: Self-pay | Admitting: Internal Medicine

## 2019-10-18 VITALS — Ht 63.0 in | Wt 151.0 lb

## 2019-10-18 DIAGNOSIS — E785 Hyperlipidemia, unspecified: Secondary | ICD-10-CM | POA: Diagnosis not present

## 2019-10-18 DIAGNOSIS — R040 Epistaxis: Secondary | ICD-10-CM | POA: Diagnosis not present

## 2019-10-18 DIAGNOSIS — K760 Fatty (change of) liver, not elsewhere classified: Secondary | ICD-10-CM | POA: Diagnosis not present

## 2019-10-18 DIAGNOSIS — Z0000 Encounter for general adult medical examination without abnormal findings: Secondary | ICD-10-CM

## 2019-10-18 DIAGNOSIS — D126 Benign neoplasm of colon, unspecified: Secondary | ICD-10-CM | POA: Diagnosis not present

## 2019-10-18 DIAGNOSIS — K439 Ventral hernia without obstruction or gangrene: Secondary | ICD-10-CM

## 2019-10-18 DIAGNOSIS — U071 COVID-19: Secondary | ICD-10-CM | POA: Diagnosis not present

## 2019-10-18 DIAGNOSIS — Z0001 Encounter for general adult medical examination with abnormal findings: Secondary | ICD-10-CM

## 2019-10-18 DIAGNOSIS — R310 Gross hematuria: Secondary | ICD-10-CM

## 2019-10-18 DIAGNOSIS — E041 Nontoxic single thyroid nodule: Secondary | ICD-10-CM | POA: Diagnosis not present

## 2019-10-18 NOTE — Patient Instructions (Signed)
We will schedule your labs on a Monday (fasting)  The augmentin will treat sinusitis.  Add saline rinses/irrigation (I Like [employer])   If the hematuria returns,  Insert a tampon to determine the source  If the nosebleed returns , ENT needs to see you to rule out Wegener's

## 2019-10-18 NOTE — Progress Notes (Signed)
Patient ID: [name], female    DOB: 08-05-1961  Age: 58 y.o. MRN: [id number]   Virtual Visit via [email or web address]  This visit type was conducted due to national recommendations for restrictions regarding the COVID-19 pandemic (e.g. social distancing).  This format is felt to be most appropriate for this patient at this time.  All issues noted in this document were discussed and addressed.  No physical exam was performed (except for noted visual exam findings with Video Visits).   I connected with@ on 10/18/19 at  3:30 PM EST by a video enabled telemedicine application and verified that I am speaking with the correct person using two identifiers. Location patient: home Location provider: work or home office Persons participating in the virtual visit: patient, provider  I discussed the limitations, risks, security and privacy concerns of performing an evaluation and management service by telephone and the availability of in person appointments. I also discussed with the patient that there may be a patient responsible charge related to this service. The patient expressed understanding and agreed to proceed.  Reason for visit: CPE  HPI: The patient is presenting for an annual preventive  examination and management of other chronic and acute problems.   The risk factors are reflected in the social history.  The roster of all physicians providing medical care to patient - is listed in the Snapshot section of the chart.  Activities of daily living:  The patient is 100% independent in all ADLs: dressing, toileting, feeding as well as independent mobility  Home safety : The patient has smoke detectors in the home. They wear seatbelts.  There are no firearms at home. There is no violence in the home.   There is no risks for hepatitis, STDs or HIV. There is no   history of blood transfusion. They have no travel history to infectious disease endemic areas of the world.  The patient has seen their dentist  in the last six month. They have seen their eye doctor in the last year.  They have deferred audiologic testing in the last year.  They do not  have excessive sun exposure. Discussed the need for sun protection: hats, long sleeves and use of sunscreen if there is significant sun exposure.   Diet: the importance of a healthy diet is discussed. They do have a healthy diet.  The benefits of regular aerobic exercise were discussed. She is recovering from  [employer] infection diagnosed Nov 25 and has not resumed regular exercise yet but has returned to work   Depression screen: there are no signs or vegative symptoms of depression- irritability, change in appetite, anhedonia, sadness/tearfullness.   The following portions of the patient's history were reviewed and updated as appropriate: allergies, current medications, past family history, past medical history,  past surgical history, past social history  and problem list.  Visual acuity was not assessed per patient preference since she has regular follow up with her ophthalmologist. Hearing and body mass index were assessed and reviewed.   During the course of the visit the patient was educated and counseled about appropriate screening and preventive services including : fall prevention , diabetes screening, nutrition counseling, colorectal cancer screening, and recommended immunizations.    CC: The primary encounter diagnosis was Cystic thyroid nodule. Diagnoses of Hyperlipidemia with target LDL less than 160, Hepatic steatosis, Epistaxis, Gross hematuria, Fatty liver, Hematuria, gross, Ventral hernia without obstruction or gangrene, Tubular adenoma of colon, Encounter for preventive health examination, and COVID-19 virus infection were also  pertinent to this visit.  1) hematuria: patient had an episode of painless gross hematuria yesterday and decided to start taking the Augmentin prescribed by the MD in [place] on Nov 25.  Denies fevers, flank pain and  nausea . She is not sure if the blood was coming from a vaginal or urethral source  And she has not had a repeat episode  2) right sided maxillary sinus pain .  The pain occurred several weeks ago and she sought ENT evaluation for sinusitis but was diagnosed with COVID 19 infection when other symptoms accompanied her sinus pain.  The pain resolved without use of antibiotics, but she has had recurrent pain over the last 2-3 days along with scant epistaxis   History [name] has a past medical history of COVID-19 virus detected (09/27/2019), DDD (degenerative disc disease), cervical, History of colon polyps, History of endometriosis, History of palpitations, History of uterine leiomyoma, Irritable bowel syndrome (IBS), Lipoma of thigh, OA (osteoarthritis), PONV (postoperative nausea and vomiting), Prolapsed hemorrhoids, Solitary right kidney (2011), Spigelian hernia (12/2018), Trochanteric bursitis of left hip, Vitamin D deficiency, and Wears contact lenses.   She has a past surgical history that includes Nephrectomy living donor (Left, 2011); Laparoscopic vaginal hysterectomy with salpingo oophorectomy (Bilateral, 04-05-2008  dr Phineas Real at [facility]); Colonoscopy (last one 09-18-2013); Tonsillectomy (age 17); DX LAPAROSCOPY W/ FULGERATION ENDOMETRIOSIS (1993;  1995); Dilation and curettage of uterus (1993); Hemorrhoid surgery (N/A, 06/04/2017); and Colonoscopy with propofol (N/A, 09/25/2019).   Her family history includes Breast cancer in her cousin and paternal aunt; Cancer in her paternal aunt and sister; Cancer (age of onset: 63) in her sister; Cancer (age of onset: 58) in her sister; Hearing loss in her daughter; Mental illness (age of onset: 45) in her father.She reports that she has never smoked. She has never used smokeless tobacco. She reports current alcohol use of about 2.0 standard drinks of alcohol per week. She reports that she does not use drugs.  Outpatient Medications Prior to Visit  Medication Sig  Dispense Refill  . amoxicillin-clavulanate (AUGMENTIN) 500-125 MG tablet Take 1 tablet by mouth 2 (two) times daily.    . Cholecalciferol (VITAMIN D-3) 5000 UNIT/ML LIQD Place 5,000 mg under the tongue daily.    Marland Kitchen estradiol (VIVELLE-DOT) 0.1 MG/24HR patch PLACE 1 PATCH ONTO THE SKIN 2 TIMES A WEEK 24 patch 11  . Magnesium 400 MG TABS Take 1 tablet by mouth 2 (two) times daily.    Marland Kitchen PARoxetine (PAXIL) 20 MG tablet Take 1 tablet (20 mg total) by mouth every morning. 90 tablet 1  . Potassium 99 MG TABS Take 1 tablet by mouth 2 (two) times daily.     . traMADol (ULTRAM) 50 MG tablet TAKE 1 TABLET BY MOUTH EVERY 8 HOURS AS NEEDED. 30 tablet 0   No facility-administered medications prior to visit.    ROS: See pertinent positives and negatives per HPI.  Past Medical History:  Diagnosis Date  . COVID-19 virus detected 09/27/2019   by [facility]   . DDD (degenerative disc disease), cervical   . History of colon polyps   . History of endometriosis   . History of palpitations   . History of uterine leiomyoma   . Irritable bowel syndrome (IBS)   . Lipoma of thigh    left  . OA (osteoarthritis)    knees  . PONV (postoperative nausea and vomiting)    severe/// per pt becomes hypotensive post-operatively  . Prolapsed hemorrhoids   . Solitary right kidney  2011   pt donated left kidney to family member   . 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 Trochanteric bursitis of left hip   . Vitamin D deficiency   . Wears contact lenses     Past Surgical History:  Procedure Laterality Date  . COLONOSCOPY  last one 09-18-2013  . COLONOSCOPY WITH PROPOFOL N/A 09/25/2019   Procedure: COLONOSCOPY WITH PROPOFOL;  Surgeon: Jonathon Bellows, MD;  Location: [facility] ENDOSCOPY;  Service: Gastroenterology;  Laterality: N/A;  . DILATION AND CURETTAGE OF UTERUS  1993   w/  suction for missed ab  . DX LAPAROSCOPY W/ [place] ENDOMETRIOSIS  1993;  1995  . HEMORRHOID SURGERY N/A 06/04/2017   Procedure:  HEMORRHOIDECTOMY single column and HEMORRHOIDOPEXY;  Surgeon: Leighton Ruff, MD;  Location: [facility];  Service: General;  Laterality: N/A;  . LAPAROSCOPIC VAGINAL HYSTERECTOMY WITH SALPINGO OOPHORECTOMY Bilateral 04-05-2008  dr Phineas Real at [facility]   w/  Fulgeration endometriosis  . NEPHRECTOMY LIVING DONOR Left 2011   to family member  . TONSILLECTOMY  age 50    SOCIAL HX:  reports that she has never smoked. She has never used smokeless tobacco. She reports current alcohol use of about 2.0 standard drinks of alcohol per week. She reports that she does not use drugs.   Current Outpatient Medications:  .  amoxicillin-clavulanate (AUGMENTIN) 500-125 MG tablet, Take 1 tablet by mouth 2 (two) times daily., Disp: , Rfl:  .  Cholecalciferol (VITAMIN D-3) 5000 UNIT/ML LIQD, Place 5,000 mg under the tongue daily., Disp: , Rfl:  .  estradiol (VIVELLE-DOT) 0.1 MG/24HR patch, PLACE 1 PATCH ONTO THE SKIN 2 TIMES A WEEK, Disp: 24 patch, Rfl: 11 .  Magnesium 400 MG TABS, Take 1 tablet by mouth 2 (two) times daily., Disp: , Rfl:  .  PARoxetine (PAXIL) 20 MG tablet, Take 1 tablet (20 mg total) by mouth every morning., Disp: 90 tablet, Rfl: 1 .  Potassium 99 MG TABS, Take 1 tablet by mouth 2 (two) times daily. , Disp: , Rfl:  .  traMADol (ULTRAM) 50 MG tablet, TAKE 1 TABLET BY MOUTH EVERY 8 HOURS AS NEEDED., Disp: 30 tablet, Rfl: 0  EXAM:  VITALS per patient if applicable:  GENERAL: alert, oriented, appears well and in no acute distress  HEENT: atraumatic, conjunttiva clear, no obvious abnormalities on inspection of external nose and ears  NECK: normal movements of the head and neck  LUNGS: on inspection no signs of respiratory distress, breathing rate appears normal, no obvious gross SOB, gasping or wheezing  CV: no obvious cyanosis  MS: moves all visible extremities without noticeable abnormality  PSYCH/NEURO: pleasant and cooperative, no obvious depression or anxiety, speech and  thought processing grossly intact  ASSESSMENT AND PLAN:  Discussed the following assessment and plan:  Cystic thyroid nodule - Plan: TSH  Hyperlipidemia with target LDL less than 160 - Plan: Lipid panel  Hepatic steatosis - Plan: Comprehensive metabolic panel  Epistaxis - Plan: Sedimentation rate, CBC with Differential/Platelet, ANCA Titers  Gross hematuria - Plan: Urinalysis, Routine w reflex microscopic (not at [facility]), Urine Culture, ANCA Titers  Fatty liver  Hematuria, gross  Ventral hernia without obstruction or gangrene - Plan: Ambulatory referral to General Surgery  Tubular adenoma of colon  Encounter for preventive health examination  COVID-19 virus infection  Fatty liver Diagnosis discussed.  Alcohol use discussed treatment discussed,   Hematuria, gross etiology unclear.  She has already started taking augmentin empirically for UTI. She is s/p hysterectomy.  Advised to insert a vaginal  tampon if the hematuria recurs.  If hematuria without infection is confirmed , then  DDx includes vasculitis with glomerulonephritis /sinusitis 2ndary to recent COVID 19 infection   Ventral hernia Suggested by history of recurrent episodes of mild lower abdominal pain . Referral to gen surg advised ; she prefers to see Leighton Ruff MD in [place]  Tubular adenoma of colon Found during 2020 colonoscopy.  7 yr follow up per GI   Encounter for preventive health examination age appropriate education and counseling updated, referrals for preventative services and immunizations addressed, dietary and smoking counseling addressed, most recent labs reviewed.  I have personally reviewed and have noted:  1) the patient's medical and social history 2) The pt's use of alcohol, tobacco, and illicit drugs 3) The patient's current medications and supplements 4) Functional ability including ADL's, fall risk, home safety risk, hearing and visual impairment 5) Diet and physical activities 6) Evidence  for depression or mood disorder 7) The patient's height, weight, and BMI have been recorded in the chart  I have made referrals, and provided counseling and education based on review of the above  COVID-19 virus infection Diagnosed Nov 25 during evaluation for presumed sinusitis. .  Multiple family members infected.  She recovered at home without complications thus far and has returned to work.     I discussed the assessment and treatment plan with the patient. The patient was provided an opportunity to ask questions and all were answered. The patient agreed with the plan and demonstrated an understanding of the instructions.   The patient was advised to call back or seek an in-person evaluation if the symptoms worsen or if the condition fails to improve as anticipated.  I provided  40 minutes of non-face-to-face time during this encounter reviewing patient's current problems and past procedures/imaging studies, providing counseling on the above mentioned problems , and coordination  of care .  Crecencio Mc, MD

## 2019-10-19 ENCOUNTER — Encounter: Payer: Self-pay | Admitting: Internal Medicine

## 2019-10-19 ENCOUNTER — Other Ambulatory Visit: Payer: Self-pay

## 2019-10-19 DIAGNOSIS — K439 Ventral hernia without obstruction or gangrene: Secondary | ICD-10-CM | POA: Insufficient documentation

## 2019-10-19 DIAGNOSIS — Z8616 Personal history of COVID-19: Secondary | ICD-10-CM | POA: Insufficient documentation

## 2019-10-19 DIAGNOSIS — R310 Gross hematuria: Secondary | ICD-10-CM | POA: Insufficient documentation

## 2019-10-19 DIAGNOSIS — D126 Benign neoplasm of colon, unspecified: Secondary | ICD-10-CM | POA: Insufficient documentation

## 2019-10-19 DIAGNOSIS — U071 COVID-19: Secondary | ICD-10-CM | POA: Insufficient documentation

## 2019-10-19 NOTE — Assessment & Plan Note (Signed)
Diagnosis discussed.  Alcohol use discussed treatment discussed,

## 2019-10-19 NOTE — Assessment & Plan Note (Addendum)
etiology unclear.  She has already started taking augmentin empirically for UTI. She is s/p hysterectomy. Advised to insert a vaginal  tampon if the hematuria recurs.  If hematuria without infection is confirmed , then  DDx includes vasculitis with glomerulonephritis /sinusitis 2ndary to recent COVID 93 infection

## 2019-10-19 NOTE — Assessment & Plan Note (Signed)

## 2019-10-19 NOTE — Assessment & Plan Note (Signed)
Diagnosed Nov 25 during evaluation for presumed sinusitis. .  Multiple family members infected.  She recovered at home without complications thus far and has returned to work.

## 2019-10-19 NOTE — Assessment & Plan Note (Signed)
Found during 2020 colonoscopy.  7 yr follow up per GI

## 2019-10-19 NOTE — Assessment & Plan Note (Addendum)
Suggested by history of recurrent episodes of mild lower abdominal pain . Referral to gen surg advised ; she prefers to see Leighton Ruff MD in [place]

## 2019-10-21 ENCOUNTER — Other Ambulatory Visit: Payer: Self-pay | Admitting: Internal Medicine

## 2019-10-21 DIAGNOSIS — G4700 Insomnia, unspecified: Secondary | ICD-10-CM | POA: Insufficient documentation

## 2019-10-21 DIAGNOSIS — F5101 Primary insomnia: Secondary | ICD-10-CM

## 2019-10-21 MED ORDER — ALPRAZOLAM 0.5 MG PO TABS: 0.5000 mg | ORAL_TABLET | Freq: Every evening | ORAL | 5 refills | Status: DC | PRN

## 2019-10-21 NOTE — Assessment & Plan Note (Signed)
Patient  has been having some trouble sleeping.  Wakes up 2 to 3 times per night . Bedtime hygiene reviewed,  Patient has been using an electronic book to read before bed.  Does not drink caffeinated beverages after 3 PM.  Only voids  bladder once per night.  No snoring partner. Does not drink alcohol to excess.  Not exercising excessively in the evening. Patient does have a history of anxiety and occasionally will  lie awake worrying about issues that cannot be resolved. Does not take stimulants. .Patient requesting alprazolam ,  Which she has taken before .  The risks and benefits of  Chronic  benzodiazepine use were discussed with patient today including increased risk of dementia,  Addiction, and seizures if abruptly withdrawn  .

## 2019-10-23 ENCOUNTER — Other Ambulatory Visit: Payer: No Typology Code available for payment source

## 2019-10-25 ENCOUNTER — Other Ambulatory Visit: Payer: Self-pay

## 2019-10-25 ENCOUNTER — Other Ambulatory Visit (INDEPENDENT_AMBULATORY_CARE_PROVIDER_SITE_OTHER): Payer: No Typology Code available for payment source

## 2019-10-25 DIAGNOSIS — R040 Epistaxis: Secondary | ICD-10-CM

## 2019-10-25 DIAGNOSIS — E785 Hyperlipidemia, unspecified: Secondary | ICD-10-CM

## 2019-10-25 DIAGNOSIS — R1900 Intra-abdominal and pelvic swelling, mass and lump, unspecified site: Secondary | ICD-10-CM

## 2019-10-25 DIAGNOSIS — E041 Nontoxic single thyroid nodule: Secondary | ICD-10-CM

## 2019-10-25 DIAGNOSIS — K760 Fatty (change of) liver, not elsewhere classified: Secondary | ICD-10-CM

## 2019-10-25 DIAGNOSIS — R310 Gross hematuria: Secondary | ICD-10-CM | POA: Diagnosis not present

## 2019-10-25 NOTE — Addendum Note (Signed)
Addended by: Zannie Cove on: 10/25/2019 01:08 PM   Modules accepted: Orders

## 2019-10-25 NOTE — Addendum Note (Signed)
Addended by: Zannie Cove on: 10/25/2019 12:43 PM   Modules accepted: Orders

## 2019-10-26 LAB — COMPREHENSIVE METABOLIC PANEL
ALT: 16 IU/L (ref 0–32)
AST: 14 IU/L (ref 0–40)
Albumin/Globulin Ratio: 1.4 (ref 1.2–2.2)
Albumin: 3.9 g/dL (ref 3.8–4.9)
Alkaline Phosphatase: 86 IU/L (ref 39–117)
BUN/Creatinine Ratio: 22 (ref 9–23)
BUN: 16 mg/dL (ref 6–24)
Bilirubin Total: 0.5 mg/dL (ref 0.0–1.2)
CO2: 28 mmol/L (ref 20–29)
Calcium: 8.9 mg/dL (ref 8.7–10.2)
Chloride: 99 mmol/L (ref 96–106)
Creatinine, Ser: 0.73 mg/dL (ref 0.57–1.00)
GFR calc Af Amer: 104 mL/min/{1.73_m2} (ref >59)
GFR calc non Af Amer: 90 mL/min/{1.73_m2} (ref >59)
Globulin, Total: 2.7 g/dL (ref 1.5–4.5)
Glucose: 79 mg/dL (ref 65–99)
Potassium: 4.8 mmol/L (ref 3.5–5.2)
Sodium: 137 mmol/L (ref 134–144)
Total Protein: 6.6 g/dL (ref 6.0–8.5)

## 2019-10-26 LAB — LIPID PANEL
Chol/HDL Ratio: 3.1 ratio (ref 0.0–4.4)
Cholesterol, Total: 229 mg/dL — ABNORMAL HIGH (ref 100–199)
HDL: 75 mg/dL (ref >39)
LDL Chol Calc (NIH): 133 mg/dL — ABNORMAL HIGH (ref 0–99)
Triglycerides: 122 mg/dL (ref 0–149)
VLDL Cholesterol Cal: 21 mg/dL (ref 5–40)

## 2019-10-26 LAB — CBC WITH DIFFERENTIAL/PLATELET
Basophils Absolute: 0 10*3/uL (ref 0.0–0.2)
Basos: 1 %
EOS (ABSOLUTE): 0.1 10*3/uL (ref 0.0–0.4)
Eos: 2 %
Hematocrit: 41.2 % (ref 34.0–46.6)
Hemoglobin: 14 g/dL (ref 11.1–15.9)
Immature Grans (Abs): 0 10*3/uL (ref 0.0–0.1)
Immature Granulocytes: 0 %
Lymphocytes Absolute: 1.4 10*3/uL (ref 0.7–3.1)
Lymphs: 21 %
MCH: 31.3 pg (ref 26.6–33.0)
MCHC: 34 g/dL (ref 31.5–35.7)
MCV: 92 fL (ref 79–97)
Monocytes Absolute: 0.4 10*3/uL (ref 0.1–0.9)
Monocytes: 6 %
Neutrophils Absolute: 4.7 10*3/uL (ref 1.4–7.0)
Neutrophils: 70 %
Platelets: 217 10*3/uL (ref 150–450)
RBC: 4.47 x10E6/uL (ref 3.77–5.28)
RDW: 13.7 % (ref 11.7–15.4)
WBC: 6.6 10*3/uL (ref 3.4–10.8)

## 2019-10-26 LAB — URINALYSIS, ROUTINE W REFLEX MICROSCOPIC
Bilirubin Urine: NEGATIVE
Hgb urine dipstick: NEGATIVE
Ketones, ur: NEGATIVE
Leukocytes,Ua: NEGATIVE
Nitrite: NEGATIVE
Specific Gravity, Urine: 1.02 (ref 1.000–1.030)
Total Protein, Urine: NEGATIVE
Urine Glucose: NEGATIVE
Urobilinogen, UA: 0.2 (ref 0.0–1.0)
pH: 8.5 — AB (ref 5.0–8.0)

## 2019-10-26 LAB — URINE CULTURE
MICRO NUMBER:: 1226798
SPECIMEN QUALITY:: ADEQUATE

## 2019-10-26 LAB — TSH: TSH: 1.18 u[IU]/mL (ref 0.450–4.500)

## 2019-10-26 LAB — ANCA TITERS
Atypical pANCA: <1:20 {titer}
C-ANCA: <1:20 {titer}
P-ANCA: <1:20 {titer}

## 2019-10-26 LAB — SEDIMENTATION RATE: Sed Rate: 8 mm/hr (ref 0–40)

## 2019-11-16 ENCOUNTER — Other Ambulatory Visit: Payer: Self-pay

## 2019-11-16 MED ORDER — TRULANCE 3 MG PO TABS: 3.0000 mg | ORAL_TABLET | Freq: Every day | ORAL | 1 refills | Status: DC

## 2019-11-16 NOTE — Progress Notes (Signed)
Pt called and stated Dr. Vicente Males had her pick up samples of Trulance to try for constipation. Pt states the Trulance worked well and would like a prescription sent to the pharmacy. Prescription has been sent.

## 2019-12-25 ENCOUNTER — Other Ambulatory Visit: Payer: Self-pay | Admitting: Internal Medicine

## 2019-12-25 DIAGNOSIS — Z1231 Encounter for screening mammogram for malignant neoplasm of breast: Secondary | ICD-10-CM

## 2019-12-27 DIAGNOSIS — H269 Unspecified cataract: Secondary | ICD-10-CM

## 2020-01-24 ENCOUNTER — Ambulatory Visit
Admission: RE | Admit: 2020-01-24 | Discharge: 2020-01-24 | Disposition: A | Discharge status: Home / Self Care | Payer: No Typology Code available for payment source | Source: Ambulatory Visit | Attending: Internal Medicine | Admitting: Internal Medicine

## 2020-01-24 DIAGNOSIS — Z1231 Encounter for screening mammogram for malignant neoplasm of breast: Secondary | ICD-10-CM | POA: Diagnosis not present

## 2020-02-14 ENCOUNTER — Ambulatory Visit (INDEPENDENT_AMBULATORY_CARE_PROVIDER_SITE_OTHER)
Admission: RE | Admit: 2020-02-14 | Discharge: 2020-02-14 | Disposition: A | Discharge status: Home / Self Care | Payer: No Typology Code available for payment source | Source: Ambulatory Visit

## 2020-02-14 DIAGNOSIS — R21 Rash and other nonspecific skin eruption: Secondary | ICD-10-CM

## 2020-02-14 DIAGNOSIS — L255 Unspecified contact dermatitis due to plants, except food: Secondary | ICD-10-CM

## 2020-02-14 MED ORDER — PREDNISONE 20 MG PO TABS: ORAL_TABLET | ORAL | 0 refills | Status: DC

## 2020-02-14 MED ORDER — HYDROXYZINE HCL 25 MG PO TABS: 12.5000 mg | ORAL_TABLET | Freq: Every evening | ORAL | 0 refills | Status: DC | PRN

## 2020-02-14 NOTE — ED Provider Notes (Signed)
Virtual Visit via Video Note:  [name]  initiated request for Telemedicine visit with [facility] team. I connected with Jule Economy Delira  on 02/14/2020 at 4:30 PM  for a synchronized telemedicine visit using a video enabled HIPPA compliant telemedicine application. I verified that I am speaking with [name]  using two identifiers. Jaynee Eagles, PA-C  was physically located in a [id number] [facility] Urgent care site and JOSANNA DENNIE was located at a different location.   The limitations of evaluation and management by telemedicine as well as the availability of in-person appointments were discussed. Patient was informed that she  may incur a bill ( including co-pay) for this virtual visit encounter. Tikesha K Umanzor  expressed understanding and gave verbal consent to proceed with virtual visit.     History of Present Illness:[name]  is a 59 y.o. female presents with 4 day hx of acute onset persistent itchy rash that started on her forearms, is spreading to her right chest and flank side, neck. Has a hx of bad reactions to poison ivy, poison oak.    ROS  No current facility-administered medications for this encounter.   Current Outpatient Medications  Medication Sig Dispense Refill  . ALPRAZolam (XANAX) 0.5 MG tablet Take 1 tablet (0.5 mg total) by mouth at bedtime as needed for anxiety. 20 tablet 5  . amoxicillin-clavulanate (AUGMENTIN) 500-125 MG tablet Take 1 tablet by mouth 2 (two) times daily.    . Cholecalciferol (VITAMIN D-3) 5000 UNIT/ML LIQD Place 5,000 mg under the tongue daily.    Marland Kitchen estradiol (VIVELLE-DOT) 0.1 MG/24HR patch PLACE 1 PATCH ONTO THE SKIN 2 TIMES A WEEK 24 patch 11  . Magnesium 400 MG TABS Take 1 tablet by mouth 2 (two) times daily.    Marland Kitchen PARoxetine (PAXIL) 20 MG tablet Take 1 tablet (20 mg total) by mouth every morning. 90 tablet 1  . Plecanatide (TRULANCE) 3 MG TABS Take 3 mg by mouth daily. 90 tablet 1  . Potassium 99 MG TABS Take 1 tablet by mouth 2  (two) times daily.     . traMADol (ULTRAM) 50 MG tablet TAKE 1 TABLET BY MOUTH EVERY 8 HOURS AS NEEDED. 30 tablet 0     No Known Allergies   Past Medical History:  Diagnosis Date  . COVID-19 virus detected 09/27/2019   by [facility]   . DDD (degenerative disc disease), cervical   . History of colon polyps   . History of endometriosis   . History of palpitations   . History of uterine leiomyoma   . Irritable bowel syndrome (IBS)   . Lipoma of thigh    left  . OA (osteoarthritis)    knees  . PONV (postoperative nausea and vomiting)    severe/// per pt becomes hypotensive post-operatively  . Prolapsed hemorrhoids   . Solitary right kidney 2011   pt donated left kidney to family member   . 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 Trochanteric bursitis of left hip   . Vitamin D deficiency   . Wears contact lenses     Past Surgical History:  Procedure Laterality Date  . COLONOSCOPY  last one 09-18-2013  . COLONOSCOPY WITH PROPOFOL N/A 09/25/2019   Procedure: COLONOSCOPY WITH PROPOFOL;  Surgeon: Jonathon Bellows, MD;  Location: [facility] ENDOSCOPY;  Service: Gastroenterology;  Laterality: N/A;  . DILATION AND CURETTAGE OF UTERUS  1993   w/  suction for missed ab  . DX  [place];  1995  . HEMORRHOID SURGERY N/A 06/04/2017   Procedure: HEMORRHOIDECTOMY single column and HEMORRHOIDOPEXY;  Surgeon: Leighton Ruff, MD;  Location: [facility];  Service: General;  Laterality: N/A;  . LAPAROSCOPIC VAGINAL HYSTERECTOMY WITH SALPINGO OOPHORECTOMY Bilateral 04-05-2008  dr Phineas Real at [facility]   w/  Fulgeration endometriosis  . NEPHRECTOMY LIVING DONOR Left 2011   to family member  . TONSILLECTOMY  age 49      Observations/Objective: Physical Exam Constitutional:      General: She is not in acute distress.    Appearance: Normal appearance. She is well-developed. She is not ill-appearing, toxic-appearing or diaphoretic.  Eyes:      Extraocular Movements: Extraocular movements intact.  Pulmonary:     Effort: Pulmonary effort is normal.  Skin:    Findings: Rash (contact dermatitis of forearms and neck) present.  Neurological:     General: No focal deficit present.     Mental Status: She is alert and oriented to person, place, and time.  Psychiatric:        Mood and Affect: Mood normal.        Behavior: Behavior normal.        Thought Content: Thought content normal.        Judgment: Judgment normal.      Assessment and Plan:  1. Contact dermatitis due to plant   2. Rash and nonspecific skin eruption    We will use a 15-day steroid course to address contact dermatitis.  Use Vistaril for severe itching. Counseled patient on potential for adverse effects with medications prescribed/recommended today, ER and return-to-clinic precautions discussed, patient verbalized understanding.    Follow Up Instructions:    I discussed the assessment and treatment plan with the patient. The patient was provided an opportunity to ask questions and all were answered. The patient agreed with the plan and demonstrated an understanding of the instructions.   The patient was advised to call back or seek an in-person evaluation if the symptoms worsen or if the condition fails to improve as anticipated.  I provided 10 minutes of non-face-to-face time during this encounter.    Jaynee Eagles, PA-C  02/14/2020 4:30 PM      Jaynee Eagles, PA-C 02/14/20 [id number]

## 2020-02-22 ENCOUNTER — Ambulatory Visit (INDEPENDENT_AMBULATORY_CARE_PROVIDER_SITE_OTHER)
Admission: RE | Admit: 2020-02-22 | Discharge: 2020-02-22 | Disposition: A | Discharge status: Home / Self Care | Payer: No Typology Code available for payment source | Source: Ambulatory Visit

## 2020-02-22 DIAGNOSIS — L03113 Cellulitis of right upper limb: Secondary | ICD-10-CM | POA: Diagnosis not present

## 2020-02-22 MED ORDER — DOXYCYCLINE HYCLATE 100 MG PO CAPS: 100.0000 mg | ORAL_CAPSULE | Freq: Two times a day (BID) | ORAL | 0 refills | Status: DC

## 2020-02-22 NOTE — Discharge Instructions (Addendum)
Take the antibiotic as directed.    Go to the emergency room if you have fever, increased redness, increased red streak, or other concerning symptoms.  Follow up with your primary care provider or come here to be seen in person if your symptoms are not improving.

## 2020-02-22 NOTE — ED Provider Notes (Signed)
Virtual Visit via Video Note:  [name]  initiated request for Telemedicine visit with [facility] team. I connected with [name]  on 02/22/2020 at 1:41 PM  for a synchronized telemedicine visit using a video enabled HIPPA compliant telemedicine application. I verified that I am speaking with [name]  using two identifiers. Sharion Balloon, NP  was physically located in a [facility]gent care site and [name] was located at a different location.   The limitations of evaluation and management by telemedicine as well as the availability of in-person appointments were discussed. Patient was informed that she  may incur a bill ( including co-pay) for this virtual visit encounter. [name]  expressed understanding and gave verbal consent to proceed with virtual visit.     History of Present Illness:[name]  is a 59 y.o. female presents for evaluation of pain, swelling, redness on right wrist x 4 days.  There is a light red streak coming away from the area to her mid forearm.  No fever or chills. She was seen for contact dermatitis on 02/14/2020 and treated with prednisone and hydroxyzine.  No other symptoms.     No Known Allergies   Past Medical History:  Diagnosis Date  . COVID-19 virus detected 09/27/2019   by [facility]   . DDD (degenerative disc disease), cervical   . History of colon polyps   . History of endometriosis   . History of palpitations   . History of uterine leiomyoma   . Irritable bowel syndrome (IBS)   . Lipoma of thigh    left  . OA (osteoarthritis)    knees  . PONV (postoperative nausea and vomiting)    severe/// per pt becomes hypotensive post-operatively  . Prolapsed hemorrhoids   . Solitary right kidney 2011   pt donated left kidney to family member   . 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 Trochanteric bursitis of left hip   . Vitamin D deficiency   . Wears contact lenses      Social History   Tobacco  Use  . Smoking status: Never Smoker  . Smokeless tobacco: Never Used  Substance Use Topics  . Alcohol use: Yes    Alcohol/week: 2.0 standard drinks    Types: 2 Shots of liquor per week  . Drug use: No   ROS: as stated in HPI.  All other systems reviewed and negative.      Observations/Objective: Physical Exam  VITALS: Patient denies fever. GENERAL: Alert, appears well and in no acute distress. HEENT: Atraumatic. NECK: Normal movements of the head and neck. CARDIOPULMONARY: No increased WOB. Speaking in clear sentences. I:E ratio WNL.  MS: Moves all visible extremities without noticeable abnormality. PSYCH: Pleasant and cooperative, well-groomed. Speech normal rate and rhythm. Affect is appropriate. Insight and judgement are appropriate. Attention is focused, linear, and appropriate.  NEURO: CN grossly intact. Oriented as arrived to appointment on time with no prompting. Moves both UE equally.  SKIN:  Border of light pink circular area on right wrist marked with black marker.     Assessment and Plan:    ICD-10-CM   1. Cellulitis of right upper extremity  L03.113        Follow Up Instructions: Treating with doxycycline.  Discussed with patient that she will need to go to the emergency department if she has signs of worsening infection.  Instructed her to follow-up with her PCP  or come here to be seen in person if her symptoms are not improving significantly in the next 2 to 3 days.  Patient agrees to plan of care.      I discussed the assessment and treatment plan with the patient. The patient was provided an opportunity to ask questions and all were answered. The patient agreed with the plan and demonstrated an understanding of the instructions.   The patient was advised to call back or seek an in-person evaluation if the symptoms worsen or if the condition fails to improve as anticipated.      Sharion Balloon, NP  02/22/2020 1:41 PM         Sharion Balloon, NP 02/22/20  [phone]

## 2020-02-26 ENCOUNTER — Telehealth: Payer: Self-pay | Admitting: Internal Medicine

## 2020-02-26 NOTE — Telephone Encounter (Signed)
FYI- pt called she saw UC virtual twice last week for Cellulitis of right upper extremity Pt wanted and in-office appt she didn't pass the screening her son has COVID right now and has been around him  Pt didn't want to do another VV

## 2020-02-26 NOTE — Telephone Encounter (Signed)
Spoke with pt and she stated that since she could not come into the office due to son having covid she went to UC. She stated that they treated her.

## 2020-03-14 ENCOUNTER — Telehealth: Payer: Self-pay

## 2020-03-14 NOTE — Telephone Encounter (Signed)
Pt left vm stating her insurance will not cover Trulance unless patient tries Linzess first. Pt is requesting a prescription for Linzess. Will consult with Dr. Vicente Males for dosage.

## 2020-03-15 ENCOUNTER — Other Ambulatory Visit: Payer: Self-pay

## 2020-03-15 MED ORDER — LINACLOTIDE 290 MCG PO CAPS: 290.0000 ug | ORAL_CAPSULE | Freq: Every day | ORAL | 0 refills | Status: DC

## 2020-03-15 NOTE — Telephone Encounter (Signed)
Ok linzess [phone]

## 2020-03-26 ENCOUNTER — Encounter: Payer: Self-pay | Admitting: Ophthalmology

## 2020-03-26 ENCOUNTER — Other Ambulatory Visit: Payer: Self-pay

## 2020-03-28 NOTE — Discharge Instructions (Signed)

## 2020-03-29 ENCOUNTER — Other Ambulatory Visit
Admission: RE | Admit: 2020-03-29 | Discharge: 2020-03-29 | Disposition: A | Discharge status: Home / Self Care | Payer: No Typology Code available for payment source | Source: Ambulatory Visit | Attending: Ophthalmology | Admitting: Ophthalmology

## 2020-03-29 ENCOUNTER — Other Ambulatory Visit: Payer: Self-pay

## 2020-03-29 DIAGNOSIS — Z01812 Encounter for preprocedural laboratory examination: Secondary | ICD-10-CM | POA: Diagnosis not present

## 2020-03-29 DIAGNOSIS — Z20822 Contact with and (suspected) exposure to covid-19: Secondary | ICD-10-CM | POA: Insufficient documentation

## 2020-03-30 LAB — SARS CORONAVIRUS 2 (TAT 6-24 HRS): SARS Coronavirus 2: NEGATIVE

## 2020-04-02 ENCOUNTER — Ambulatory Visit: Payer: No Typology Code available for payment source | Admitting: Anesthesiology

## 2020-04-02 ENCOUNTER — Encounter: Payer: Self-pay | Admitting: Ophthalmology

## 2020-04-02 ENCOUNTER — Ambulatory Visit
Admission: RE | Admit: 2020-04-02 | Discharge: 2020-04-02 | Disposition: A | Discharge status: Home / Self Care | Payer: No Typology Code available for payment source | Source: Ambulatory Visit | Attending: Ophthalmology | Admitting: Ophthalmology

## 2020-04-02 ENCOUNTER — Encounter
Admission: RE | Disposition: A | Discharge status: Home / Self Care | Payer: Self-pay | Source: Ambulatory Visit | Attending: Ophthalmology

## 2020-04-02 ENCOUNTER — Other Ambulatory Visit: Payer: Self-pay

## 2020-04-02 DIAGNOSIS — K581 Irritable bowel syndrome with constipation: Secondary | ICD-10-CM | POA: Insufficient documentation

## 2020-04-02 DIAGNOSIS — F419 Anxiety disorder, unspecified: Secondary | ICD-10-CM | POA: Insufficient documentation

## 2020-04-02 DIAGNOSIS — H2511 Age-related nuclear cataract, right eye: Secondary | ICD-10-CM | POA: Insufficient documentation

## 2020-04-02 DIAGNOSIS — I493 Ventricular premature depolarization: Secondary | ICD-10-CM | POA: Insufficient documentation

## 2020-04-02 DIAGNOSIS — M1990 Unspecified osteoarthritis, unspecified site: Secondary | ICD-10-CM | POA: Diagnosis not present

## 2020-04-02 DIAGNOSIS — Z85828 Personal history of other malignant neoplasm of skin: Secondary | ICD-10-CM | POA: Insufficient documentation

## 2020-04-02 DIAGNOSIS — Z905 Acquired absence of kidney: Secondary | ICD-10-CM | POA: Insufficient documentation

## 2020-04-02 HISTORY — PX: CATARACT EXTRACTION W/PHACO: SHX586

## 2020-04-02 SURGERY — PHACOEMULSIFICATION, CATARACT, WITH IOL INSERTION
Anesthesia: Monitor Anesthesia Care | Site: Eye | Laterality: Right

## 2020-04-02 MED ORDER — ONDANSETRON HCL 4 MG/2ML IJ SOLN: 4.0000 mg | Freq: Once | INTRAMUSCULAR | Status: DC | PRN

## 2020-04-02 MED ORDER — TETRACAINE HCL 0.5 % OP SOLN
1.0000 [drp] | OPHTHALMIC | Status: DC | PRN
  Administered 2020-04-02 (×2): 1 [drp] via OPHTHALMIC

## 2020-04-02 MED ORDER — ACETAMINOPHEN 325 MG PO TABS: 325.0000 mg | ORAL_TABLET | ORAL | Status: DC | PRN

## 2020-04-02 MED ORDER — LIDOCAINE HCL (PF) 2 % IJ SOLN
INTRAOCULAR | Status: DC | PRN
  Administered 2020-04-02: 1 mL

## 2020-04-02 MED ORDER — MIDAZOLAM HCL 2 MG/2ML IJ SOLN
INTRAMUSCULAR | Status: DC | PRN
  Administered 2020-04-02: 2 mg via INTRAVENOUS

## 2020-04-02 MED ORDER — NA CHONDROIT SULF-NA HYALURON 40-17 MG/ML IO SOLN
INTRAOCULAR | Status: DC | PRN
  Administered 2020-04-02: 1 mL via INTRAOCULAR

## 2020-04-02 MED ORDER — BRIMONIDINE TARTRATE-TIMOLOL 0.2-0.5 % OP SOLN
OPHTHALMIC | Status: DC | PRN
  Administered 2020-04-02: 1 [drp] via OPHTHALMIC

## 2020-04-02 MED ORDER — ARMC OPHTHALMIC DILATING DROPS
1.0000 "application " | OPHTHALMIC | Status: DC | PRN
  Administered 2020-04-02 (×3): 1 via OPHTHALMIC

## 2020-04-02 MED ORDER — ACETAMINOPHEN 160 MG/5ML PO SOLN: 325.0000 mg | ORAL | Status: DC | PRN

## 2020-04-02 MED ORDER — EPINEPHRINE PF 1 MG/ML IJ SOLN
INTRAOCULAR | Status: DC | PRN
  Administered 2020-04-02: 44 mL via OPHTHALMIC

## 2020-04-02 MED ORDER — FENTANYL CITRATE (PF) 100 MCG/2ML IJ SOLN
INTRAMUSCULAR | Status: DC | PRN
  Administered 2020-04-02 (×2): 50 ug via INTRAVENOUS

## 2020-04-02 MED ORDER — MOXIFLOXACIN HCL 0.5 % OP SOLN
OPHTHALMIC | Status: DC | PRN
  Administered 2020-04-02: 0.2 mL via OPHTHALMIC

## 2020-04-02 SURGICAL SUPPLY — 22 items
CANNULA ANT/CHMB 27G (MISCELLANEOUS) ×2 IMPLANT
CANNULA ANT/CHMB 27GA (MISCELLANEOUS) ×6 IMPLANT
GLOVE SURG LX 8.0 MICRO (GLOVE) ×4
GLOVE SURG LX STRL 8.0 MICRO (GLOVE) ×1 IMPLANT
GLOVE SURG TRIUMPH 8.0 PF LTX (GLOVE) ×3 IMPLANT
GOWN STRL REUS W/ TWL LRG LVL3 (GOWN DISPOSABLE) ×2 IMPLANT
GOWN STRL REUS W/TWL LRG LVL3 (GOWN DISPOSABLE) ×6
LENS IOL IQ PAN TRC 30 30.0 IMPLANT
LENS IOL PANOPTIX TORIC 30.0 ×3 IMPLANT
MARKER SKIN DUAL TIP RULER LAB (MISCELLANEOUS) ×3 IMPLANT
NDL FILTER BLUNT 18X1 1/2 (NEEDLE) ×1 IMPLANT
NEEDLE FILTER BLUNT 18X 1/2SAF (NEEDLE) ×2
NEEDLE FILTER BLUNT 18X1 1/2 (NEEDLE) ×1 IMPLANT
PACK EYE AFTER SURG (MISCELLANEOUS) ×3 IMPLANT
PACK OPTHALMIC (MISCELLANEOUS) ×3 IMPLANT
PACK PORFILIO (MISCELLANEOUS) ×3 IMPLANT
SUT ETHILON 10-0 CS-B-6CS-B-6 (SUTURE)
SUTURE EHLN 10-0 CS-B-6CS-B-6 (SUTURE) IMPLANT
SYR 3ML LL SCALE MARK (SYRINGE) ×3 IMPLANT
SYR TB 1ML LUER SLIP (SYRINGE) ×3 IMPLANT
WATER STERILE IRR 250ML POUR (IV SOLUTION) ×3 IMPLANT
WIPE NON LINTING 3.25X3.25 (MISCELLANEOUS) ×3 IMPLANT

## 2020-04-02 NOTE — Anesthesia Procedure Notes (Signed)
Procedure Name: MAC Date/Time: 04/02/2020 11:05 AM Performed by: Jeannene Patella, CRNA Pre-anesthesia Checklist: Patient identified, Emergency Drugs available, Suction available, Patient being monitored and Timeout performed Patient Re-evaluated:Patient Re-evaluated prior to induction Oxygen Delivery Method: Nasal cannula

## 2020-04-02 NOTE — Op Note (Signed)
PREOPERATIVE DIAGNOSIS:  Nuclear sclerotic cataract of the right eye.   POSTOPERATIVE DIAGNOSIS:  Nuclear sclerotic cataract of the right eye.   OPERATIVE PROCEDURE: Procedure(s): CATARACT EXTRACTION PHACO AND INTRAOCULAR LENS PLACEMENT (IOC) RIGHT VIVITY TORIC LENS   SURGEON:  Birder Robson, MD.   ANESTHESIA: 1.      Managed anesthesia care. 2.     0.64ml of Shugarcaine was instilled following the paracentesis  Anesthesiologist: Veda Canning, MD CRNA: Jeannene Patella, CRNA  COMPLICATIONS:  None.   TECHNIQUE:   Stop and chop    DESCRIPTION OF PROCEDURE:  The patient was examined and consented in the preoperative holding area where the aforementioned topical anesthesia was applied to the right eye.  The patient was brought back to the Operating Room where he was sat upright on the gurney and given a target to fixate upon while the eye was marked at the 3:00 and 9:00 position.  The patient was then reclined on the operating table.  The eye was prepped and draped in the usual sterile ophthalmic fashion and a lid speculum was placed. A paracentesis was created with the side port blade and the anterior chamber was filled with viscoelastic. A near clear corneal incision was performed with the steel keratome. A continuous curvilinear capsulorrhexis was performed with a cystotome followed by the capsulorrhexis forceps. Hydrodissection and hydrodelineation were carried out with BSS on a blunt cannula. The lens was removed in a stop and chop technique and the remaining cortical material was removed with the irrigation-aspiration handpiece. The eye was inflated with viscoelastic and the TNFT30  lens  was placed in the eye and rotated to within a few degrees of the predetermined orientation.  The remaining viscoelastic was removed from the eye.  The Sinskey hook was used to rotate the toric lens into its final resting place at 094 degrees.  The eye was inflated to a physiologic pressure and found to be  watertight. 0.68ml of Vigamox was placed in the anterior chamber.  The eye was dressed with Vigamox. The patient was given protective glasses to wear throughout the day and a shield with which to sleep tonight. The patient was also given drops with which to begin a drop regimen today and will follow-up with me in one day. Implant Name Type Inv. Item Serial No. Manufacturer Lot No. LRB No. Used Action  ALCON ACRYSOF PANOPTIX TORIC IOL Intraocular Lens  [id number] ALCON  Right 1 Implanted   Procedure(s) with comments: CATARACT EXTRACTION PHACO AND INTRAOCULAR LENS PLACEMENT (IOC) RIGHT VIVITY TORIC LENS (Right) - 2.09 0:25.6  Electronically signed: Birder Robson 04/02/2020 11:23 AM

## 2020-04-02 NOTE — H&P (Signed)
All labs reviewed. Abnormal studies sent to patients PCP when indicated.  Previous H&P reviewed, patient examined, there are NO CHANGES.  Helen Vesey Porfilio6/1/202110:52 AM

## 2020-04-02 NOTE — Transfer of Care (Signed)
Immediate Anesthesia Transfer of Care Note  Patient: [name]  Procedure(s) Performed: CATARACT EXTRACTION PHACO AND INTRAOCULAR LENS PLACEMENT (IOC) RIGHT VIVITY TORIC LENS (Right Eye)  Patient Location: PACU  Anesthesia Type: MAC  Level of Consciousness: awake, alert  and patient cooperative  Airway and Oxygen Therapy: Patient Spontanous Breathing and Patient connected to supplemental oxygen  Post-op Assessment: Post-op Vital signs reviewed, Patient's Cardiovascular Status Stable, Respiratory Function Stable, Patent Airway and No signs of Nausea or vomiting  Post-op Vital Signs: Reviewed and stable  Complications: No apparent anesthesia complications

## 2020-04-02 NOTE — Anesthesia Postprocedure Evaluation (Signed)
Anesthesia Post Note  Patient: [name]  Procedure(s) Performed: CATARACT EXTRACTION PHACO AND INTRAOCULAR LENS PLACEMENT (IOC) RIGHT VIVITY TORIC LENS (Right Eye)     Patient location during evaluation: PACU Anesthesia Type: MAC Level of consciousness: awake Pain management: pain level controlled Vital Signs Assessment: post-procedure vital signs reviewed and stable Respiratory status: respiratory function stable Cardiovascular status: stable Postop Assessment: no apparent nausea or vomiting Anesthetic complications: no    Veda Canning

## 2020-04-02 NOTE — Anesthesia Preprocedure Evaluation (Signed)
Anesthesia Evaluation  Patient identified by MRN, date of birth, ID band Patient awake    Reviewed: Allergy & Precautions, NPO status , Patient's Chart, lab work & pertinent test results  History of Anesthesia Complications (+) PONV  Airway Mallampati: II  TM Distance: >3 FB     Dental   Pulmonary    breath sounds clear to auscultation       Cardiovascular Exercise Tolerance: Good negative cardio ROS   Rhythm:Regular Rate:Normal     Neuro/Psych Anxiety    GI/Hepatic IBS   Endo/Other    Renal/GU Solitary right kidney     Musculoskeletal  (+) Arthritis ,   Abdominal   Peds  Hematology   Anesthesia Other Findings   Reproductive/Obstetrics                             Anesthesia Physical Anesthesia Plan  ASA: II  Anesthesia Plan: MAC   Post-op Pain Management:    Induction: Intravenous  PONV Risk Score and Plan: TIVA, Midazolam and Treatment may vary due to age or medical condition  Airway Management Planned: Natural Airway and Nasal Cannula  Additional Equipment:   Intra-op Plan:   Post-operative Plan:   Informed Consent: I have reviewed the patients History and Physical, chart, labs and discussed the procedure including the risks, benefits and alternatives for the proposed anesthesia with the patient or authorized representative who has indicated his/her understanding and acceptance.       Plan Discussed with: CRNA  Anesthesia Plan Comments:         Anesthesia Quick Evaluation

## 2020-04-03 ENCOUNTER — Encounter: Payer: Self-pay | Admitting: *Deleted

## 2020-04-11 ENCOUNTER — Other Ambulatory Visit: Payer: Self-pay

## 2020-04-11 ENCOUNTER — Encounter: Payer: Self-pay | Admitting: Ophthalmology

## 2020-04-19 ENCOUNTER — Other Ambulatory Visit: Payer: Self-pay

## 2020-04-19 ENCOUNTER — Other Ambulatory Visit
Admission: RE | Admit: 2020-04-19 | Discharge: 2020-04-19 | Disposition: A | Discharge status: Home / Self Care | Payer: No Typology Code available for payment source | Source: Ambulatory Visit | Attending: Ophthalmology | Admitting: Ophthalmology

## 2020-04-19 DIAGNOSIS — Z01812 Encounter for preprocedural laboratory examination: Secondary | ICD-10-CM | POA: Diagnosis present

## 2020-04-19 DIAGNOSIS — Z20822 Contact with and (suspected) exposure to covid-19: Secondary | ICD-10-CM | POA: Diagnosis not present

## 2020-04-19 LAB — SARS CORONAVIRUS 2 (TAT 6-24 HRS): SARS Coronavirus 2: NEGATIVE

## 2020-04-19 NOTE — Discharge Instructions (Signed)

## 2020-04-23 ENCOUNTER — Ambulatory Visit: Payer: No Typology Code available for payment source | Admitting: Anesthesiology

## 2020-04-23 ENCOUNTER — Encounter
Admission: RE | Disposition: A | Discharge status: Home / Self Care | Payer: Self-pay | Source: Home / Self Care | Attending: Ophthalmology

## 2020-04-23 ENCOUNTER — Encounter: Payer: Self-pay | Admitting: Ophthalmology

## 2020-04-23 ENCOUNTER — Other Ambulatory Visit: Payer: Self-pay

## 2020-04-23 ENCOUNTER — Ambulatory Visit
Admission: RE | Admit: 2020-04-23 | Discharge: 2020-04-23 | Disposition: A | Discharge status: Home / Self Care | Payer: No Typology Code available for payment source | Attending: Ophthalmology | Admitting: Ophthalmology

## 2020-04-23 DIAGNOSIS — F419 Anxiety disorder, unspecified: Secondary | ICD-10-CM | POA: Diagnosis not present

## 2020-04-23 DIAGNOSIS — Z905 Acquired absence of kidney: Secondary | ICD-10-CM | POA: Insufficient documentation

## 2020-04-23 DIAGNOSIS — H2512 Age-related nuclear cataract, left eye: Secondary | ICD-10-CM | POA: Insufficient documentation

## 2020-04-23 DIAGNOSIS — Z79899 Other long term (current) drug therapy: Secondary | ICD-10-CM | POA: Insufficient documentation

## 2020-04-23 DIAGNOSIS — Z79890 Hormone replacement therapy (postmenopausal): Secondary | ICD-10-CM | POA: Insufficient documentation

## 2020-04-23 DIAGNOSIS — Z85828 Personal history of other malignant neoplasm of skin: Secondary | ICD-10-CM | POA: Diagnosis not present

## 2020-04-23 HISTORY — PX: CATARACT EXTRACTION W/PHACO: SHX586

## 2020-04-23 SURGERY — PHACOEMULSIFICATION, CATARACT, WITH IOL INSERTION
Anesthesia: Monitor Anesthesia Care | Site: Eye | Laterality: Left

## 2020-04-23 MED ORDER — TETRACAINE HCL 0.5 % OP SOLN
1.0000 [drp] | OPHTHALMIC | Status: DC | PRN
  Administered 2020-04-23 (×3): 1 [drp] via OPHTHALMIC

## 2020-04-23 MED ORDER — ARMC OPHTHALMIC DILATING DROPS
1.0000 "application " | OPHTHALMIC | Status: DC | PRN
  Administered 2020-04-23 (×3): 1 via OPHTHALMIC

## 2020-04-23 MED ORDER — NA CHONDROIT SULF-NA HYALURON 40-17 MG/ML IO SOLN
INTRAOCULAR | Status: DC | PRN
  Administered 2020-04-23: 1 mL via INTRAOCULAR

## 2020-04-23 MED ORDER — ACETAMINOPHEN 160 MG/5ML PO SOLN: 325.0000 mg | ORAL | Status: DC | PRN

## 2020-04-23 MED ORDER — ACETAMINOPHEN 325 MG PO TABS: 325.0000 mg | ORAL_TABLET | ORAL | Status: DC | PRN

## 2020-04-23 MED ORDER — EPINEPHRINE PF 1 MG/ML IJ SOLN
INTRAOCULAR | Status: DC | PRN
  Administered 2020-04-23: 42 mL via OPHTHALMIC

## 2020-04-23 MED ORDER — MOXIFLOXACIN HCL 0.5 % OP SOLN
OPHTHALMIC | Status: DC | PRN
  Administered 2020-04-23: 0.2 mL via OPHTHALMIC

## 2020-04-23 MED ORDER — MIDAZOLAM HCL 2 MG/2ML IJ SOLN
INTRAMUSCULAR | Status: DC | PRN
  Administered 2020-04-23 (×2): 1 mg via INTRAVENOUS

## 2020-04-23 MED ORDER — LIDOCAINE HCL (PF) 2 % IJ SOLN
INTRAOCULAR | Status: DC | PRN
  Administered 2020-04-23: 2 mL

## 2020-04-23 MED ORDER — ONDANSETRON HCL 4 MG/2ML IJ SOLN: 4.0000 mg | Freq: Once | INTRAMUSCULAR | Status: DC | PRN

## 2020-04-23 MED ORDER — BRIMONIDINE TARTRATE-TIMOLOL 0.2-0.5 % OP SOLN
OPHTHALMIC | Status: DC | PRN
  Administered 2020-04-23: 1 [drp] via OPHTHALMIC

## 2020-04-23 MED ORDER — FENTANYL CITRATE (PF) 100 MCG/2ML IJ SOLN
INTRAMUSCULAR | Status: DC | PRN
  Administered 2020-04-23: 50 ug via INTRAVENOUS

## 2020-04-23 SURGICAL SUPPLY — 20 items
CANNULA ANT/CHMB 27G (MISCELLANEOUS) ×2 IMPLANT
CANNULA ANT/CHMB 27GA (MISCELLANEOUS) ×6 IMPLANT
GLOVE SURG LX 8.0 MICRO (GLOVE) ×2
GLOVE SURG LX STRL 8.0 MICRO (GLOVE) ×1 IMPLANT
GLOVE SURG TRIUMPH 8.0 PF LTX (GLOVE) ×3 IMPLANT
GOWN STRL REUS W/ TWL LRG LVL3 (GOWN DISPOSABLE) ×2 IMPLANT
GOWN STRL REUS W/TWL LRG LVL3 (GOWN DISPOSABLE) ×6
LENS IOL IQ PAN TRC 30 26.5 IMPLANT
LENS IOL PANOPTIX TORIC 26.5 ×3 IMPLANT
MARKER SKIN DUAL TIP RULER LAB (MISCELLANEOUS) ×3 IMPLANT
NDL FILTER BLUNT 18X1 1/2 (NEEDLE) ×1 IMPLANT
NEEDLE FILTER BLUNT 18X 1/2SAF (NEEDLE) ×2
NEEDLE FILTER BLUNT 18X1 1/2 (NEEDLE) ×1 IMPLANT
PACK EYE AFTER SURG (MISCELLANEOUS) ×3 IMPLANT
PACK OPTHALMIC (MISCELLANEOUS) ×3 IMPLANT
PACK PORFILIO (MISCELLANEOUS) ×3 IMPLANT
SYR 3ML LL SCALE MARK (SYRINGE) ×3 IMPLANT
SYR TB 1ML LUER SLIP (SYRINGE) ×3 IMPLANT
WATER STERILE IRR 250ML POUR (IV SOLUTION) ×3 IMPLANT
WIPE NON LINTING 3.25X3.25 (MISCELLANEOUS) ×3 IMPLANT

## 2020-04-23 NOTE — Op Note (Signed)
PREOPERATIVE DIAGNOSIS:  Nuclear sclerotic cataract of the left eye.   POSTOPERATIVE DIAGNOSIS:  Nuclear sclerotic cataract of the left eye.   OPERATIVE PROCEDURE: Procedure(s): CATARACT EXTRACTION PHACO AND INTRAOCULAR LENS PLACEMENT (IOC) LEFT PANOPTIX TORIC 4.42 00:28.6   SURGEON:  Birder Robson, MD.   ANESTHESIA: 1.      Managed anesthesia care. 2.     0.13ml os Shugarcaine was instilled following the paracentesis [email or web address]@   COMPLICATIONS:  None.   TECHNIQUE:   Stop and chop    DESCRIPTION OF PROCEDURE:  The patient was examined and consented in the preoperative holding area where the aforementioned topical anesthesia was applied to the left eye.  The patient was brought back to the Operating Room where he was sat upright on the gurney and given a target to fixate upon while the eye was marked at the 3:00 and 9:00 position.  The patient was then reclined on the operating table.  The eye was prepped and draped in the usual sterile ophthalmic fashion and a lid speculum was placed. A paracentesis was created with the side port blade and the anterior chamber was filled with viscoelastic. A near clear corneal incision was performed with the steel keratome. A continuous curvilinear capsulorrhexis was performed with a cystotome followed by the capsulorrhexis forceps. Hydrodissection and hydrodelineation were carried out with BSS on a blunt cannula. The lens was removed in a stop and chop technique and the remaining cortical material was removed with the irrigation-aspiration handpiece. The eye was inflated with viscoelastic and the Panoptix 30 lens was placed in the eye and rotated to within a few degrees of the predetermined orientation.  The remaining viscoelastic was removed from the eye.  The Sinskey hook was used to rotate the toric lens into its final resting place at 088 degrees.  0.1 ml of Vigamox was placed in the anterior chamber. The eye was inflated to a physiologic pressure and  found to be watertight.  The eye was dressed with Vigamox. The patient was given protective glasses to wear throughout the day and a shield with which to sleep tonight. The patient was also given drops with which to begin a drop regimen today and will follow-up with me in one day. Implant Name Type Inv. Item Serial No. Manufacturer Lot No. LRB No. Used Action  LENS ACRYSOF TORIC TRIFO 26.5 - [id number]  LENS ACRYSOF TORIC TRIFO 26.5 [id number] ALCON  Left 1 Implanted   Procedure(s): CATARACT EXTRACTION PHACO AND INTRAOCULAR LENS PLACEMENT (IOC) LEFT PANOPTIX TORIC 4.42 00:28.6 (Left)  Electronically signed: Birder Robson 6/22/202110:29 AM

## 2020-04-23 NOTE — Transfer of Care (Signed)
Immediate Anesthesia Transfer of Care Note  Patient: [name]  Procedure(s) Performed: CATARACT EXTRACTION PHACO AND INTRAOCULAR LENS PLACEMENT (IOC) LEFT PANOPTIX TORIC 4.42 00:28.6 (Left Eye)  Patient Location: PACU  Anesthesia Type: MAC  Level of Consciousness: awake, alert  and patient cooperative  Airway and Oxygen Therapy: Patient Spontanous Breathing and Patient connected to supplemental oxygen  Post-op Assessment: Post-op Vital signs reviewed, Patient's Cardiovascular Status Stable, Respiratory Function Stable, Patent Airway and No signs of Nausea or vomiting  Post-op Vital Signs: Reviewed and stable  Complications: No complications documented.

## 2020-04-23 NOTE — H&P (Signed)
All labs reviewed. Abnormal studies sent to patients PCP when indicated.  Previous H&P reviewed, patient examined, there are NO CHANGES.  Helen Rutigliano Porfilio6/22/202110:06 AM

## 2020-04-23 NOTE — Anesthesia Postprocedure Evaluation (Signed)
Anesthesia Post Note  Patient: [name]  Procedure(s) Performed: CATARACT EXTRACTION PHACO AND INTRAOCULAR LENS PLACEMENT (IOC) LEFT PANOPTIX TORIC 4.42 00:28.6 (Left Eye)     Patient location during evaluation: PACU Anesthesia Type: MAC Level of consciousness: awake Pain management: pain level controlled Vital Signs Assessment: post-procedure vital signs reviewed and stable Respiratory status: respiratory function stable Cardiovascular status: stable Postop Assessment: no apparent nausea or vomiting Anesthetic complications: no   No complications documented.  Veda Canning

## 2020-04-23 NOTE — Anesthesia Preprocedure Evaluation (Signed)
Anesthesia Evaluation  Patient identified by MRN, date of birth, ID band Patient awake    Reviewed: Allergy & Precautions, NPO status   History of Anesthesia Complications (+) PONV  Airway Mallampati: II  TM Distance: >3 FB     Dental   Pulmonary    breath sounds clear to auscultation       Cardiovascular Exercise Tolerance: Good negative cardio ROS   Rhythm:Regular Rate:Normal     Neuro/Psych Anxiety    GI/Hepatic IBS   Endo/Other    Renal/GU Solitary right kidney     Musculoskeletal  (+) Arthritis ,   Abdominal   Peds  Hematology   Anesthesia Other Findings   Reproductive/Obstetrics                             Anesthesia Physical  Anesthesia Plan  ASA: II  Anesthesia Plan: MAC   Post-op Pain Management:    Induction: Intravenous  PONV Risk Score and Plan: TIVA, Midazolam and Treatment may vary due to age or medical condition  Airway Management Planned: Natural Airway and Nasal Cannula  Additional Equipment:   Intra-op Plan:   Post-operative Plan:   Informed Consent: I have reviewed the patients History and Physical, chart, labs and discussed the procedure including the risks, benefits and alternatives for the proposed anesthesia with the patient or authorized representative who has indicated his/her understanding and acceptance.       Plan Discussed with: CRNA  Anesthesia Plan Comments:         Anesthesia Quick Evaluation

## 2020-04-23 NOTE — Anesthesia Procedure Notes (Signed)
Procedure Name: MAC Date/Time: 04/23/2020 10:11 AM Performed by: Cameron Ali, CRNA Pre-anesthesia Checklist: Patient identified, Emergency Drugs available, Suction available, Timeout performed and Patient being monitored Patient Re-evaluated:Patient Re-evaluated prior to induction Oxygen Delivery Method: Nasal cannula Placement Confirmation: positive ETCO2

## 2020-04-24 ENCOUNTER — Encounter: Payer: Self-pay | Admitting: Ophthalmology

## 2020-05-23 ENCOUNTER — Other Ambulatory Visit: Payer: Self-pay | Admitting: Internal Medicine

## 2020-06-19 ENCOUNTER — Encounter: Payer: Self-pay | Admitting: Internal Medicine

## 2020-06-19 ENCOUNTER — Other Ambulatory Visit: Payer: Self-pay

## 2020-06-19 ENCOUNTER — Ambulatory Visit (INDEPENDENT_AMBULATORY_CARE_PROVIDER_SITE_OTHER): Payer: No Typology Code available for payment source | Admitting: Internal Medicine

## 2020-06-19 ENCOUNTER — Ambulatory Visit (INDEPENDENT_AMBULATORY_CARE_PROVIDER_SITE_OTHER): Payer: No Typology Code available for payment source

## 2020-06-19 VITALS — BP 120/74 | HR 83 | Temp 98.0°F | Resp 15 | Ht 63.0 in | Wt 156.8 lb

## 2020-06-19 DIAGNOSIS — G8929 Other chronic pain: Secondary | ICD-10-CM

## 2020-06-19 DIAGNOSIS — M25552 Pain in left hip: Secondary | ICD-10-CM

## 2020-06-19 DIAGNOSIS — Z8616 Personal history of COVID-19: Secondary | ICD-10-CM | POA: Diagnosis not present

## 2020-06-19 DIAGNOSIS — K760 Fatty (change of) liver, not elsewhere classified: Secondary | ICD-10-CM

## 2020-06-19 MED ORDER — DICLOFENAC SODIUM 75 MG PO TBEC
75.0000 mg | DELAYED_RELEASE_TABLET | Freq: Two times a day (BID) | ORAL | 0 refills | Status: DC
Start: 2020-06-19 — End: 2023-05-20

## 2020-06-19 MED ORDER — TRAMADOL HCL 50 MG PO TABS: 50.0000 mg | ORAL_TABLET | Freq: Three times a day (TID) | ORAL | 0 refills | Status: AC | PRN

## 2020-06-19 NOTE — Patient Instructions (Addendum)
Ok to take  650 mg tylenol twice daily  Ok  Use diclofenac twice daily  instead of motrin   Tramadol ok to use if tylenol becomes an issue  Ortho vs sports medicine referral depending on  X ray and pain control

## 2020-06-19 NOTE — Progress Notes (Signed)
Subjective:  Patient ID: [name], female    DOB: [birth year]  Age: 59 y.o. MRN: [id number]  CC: The primary encounter diagnosis was Chronic hip pain, left. Diagnoses of Fatty liver, History of 2019 novel coronavirus disease (COVID-19), and Chronic left hip pain were also pertinent to this visit.  HPI [name] presents for evaluation of chronic  left hip pain for the past year,  SHE REPORTS that pain that localizes to the left lateral hip over the trochanter . The area is tender to deep palpation and aggravated by walking and sitting for long periods of time.  She has pain with raising the hip to climb stairs.  She has been stretching, which also bothers it , but  not as much as walking.  The pain radiates to the anterior thigh and anterior shin. She has temporary relief with advil,  But the pain is interfering with sleep  She was diagnosed with trochanteric bursitis by rheumatologist Precious Reel in 2015 and given an I/A steroid injection that did not provide even transient relief. The pain has become more constant and more severe over the past year.  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    This visit occurred during the SARS-CoV-2 public health emergency.  Safety protocols were in place, including screening questions prior to the visit, additional usage of staff PPE, and extensive cleaning of exam room while observing appropriate contact time as indicated for disinfecting solutions.   History of COVID 19 infection.  Had an adverse reaction to the vaccine which she received AFTER RECOVERING FROM THE VIRAL INFECTION     Outpatient Medications Prior to Visit  Medication Sig Dispense Refill  . ALPRAZolam (XANAX) 0.5 MG tablet TAKE 1 TABLET (0.5 MG TOTAL) BY MOUTH AT BEDTIME AS NEEDED FOR ANXIETY. 20 tablet 2  . docusate sodium (COLACE) 100 MG capsule Take 100 mg by mouth daily as needed for mild  constipation.    Marland Kitchen estradiol (VIVELLE-DOT) 0.1 MG/24HR patch PLACE 1 PATCH ONTO THE SKIN 2 TIMES A WEEK 24 patch 11  . linaclotide (LINZESS) 290 MCG CAPS capsule Take 1 capsule (290 mcg total) by mouth daily before breakfast. 90 capsule 0  . Magnesium 400 MG TABS Take 1 tablet by mouth 2 (two) times daily.    Marland Kitchen PARoxetine (PAXIL) 20 MG tablet Take 1 tablet (20 mg total) by mouth every morning. 90 tablet 1  . Potassium 99 MG TABS Take 1 tablet by mouth 2 (two) times daily.     . TURMERIC PO Take by mouth daily.    . Vitamin D-Vitamin K (VITAMIN K2-VITAMIN D3 PO) Take 1 capsule by mouth daily.    . Calcium-Vit D-Arg-Inos-Silicon (BONE DENSITY PO) Take by mouth daily. (Patient not taking: Reported on 06/19/2020)    . Cholecalciferol (VITAMIN D-3) 5000 UNIT/ML LIQD Place 5,000 mg under the tongue daily. (Patient not taking: Reported on 06/19/2020)    . hydrOXYzine (ATARAX/VISTARIL) 25 MG tablet Take 0.5-1 tablets (12.5-25 mg total) by mouth at bedtime as needed for itching. (Patient not taking: Reported on 03/26/2020) 30 tablet 0   No facility-administered medications prior to visit.    Review of Systems;  Patient denies headache, fevers, malaise, unintentional weight loss, skin rash, eye pain, sinus congestion and sinus pain, sore throat, dysphagia,  hemoptysis , cough, dyspnea, wheezing, chest pain, palpitations, orthopnea, edema, abdominal pain, nausea, melena,  diarrhea, constipation, flank pain, dysuria, hematuria, urinary  Frequency, nocturia, numbness, tingling, seizures,  Focal weakness, Loss of consciousness,  Tremor, insomnia, depression, anxiety, and suicidal ideation.      Objective:  BP 120/74 (BP Location: Left Arm, Patient Position: Sitting, Cuff Size: Normal)   Pulse 83   Temp 98 F (36.7 C) (Oral)   Resp 15   Ht 5\' 3"  (1.6 m)   Wt 156 lb 12.8 oz (71.1 kg)   SpO2 96%   BMI 27.78 kg/m   BP Readings from Last 3 Encounters:  06/19/20 120/74  04/23/20 110/63  04/02/20 127/77     Wt Readings from Last 3 Encounters:  06/19/20 156 lb 12.8 oz (71.1 kg)  04/23/20 153 lb (69.4 kg)  04/02/20 156 lb (70.8 kg)    General appearance: alert, cooperative and appears stated age Ears: normal TM's and external ear canals both ears Throat: lips, mucosa, and tongue normal; teeth and gums normal Neck: no adenopathy, no carotid bruit, supple, symmetrical, trachea midline and thyroid not enlarged, symmetric, no tenderness/mass/nodules Back: symmetric, no curvature. ROM normal. No CVA tenderness. Lungs: clear to auscultation bilaterally Heart: regular rate and rhythm, S1, S2 normal, no murmur, click, rub or gallop Abdomen: soft, non-tender; bowel sounds normal; no masses,  no organomegaly Pulses: 2+ and symmetric Skin: Skin color, texture, turgor normal. No rashes or lesions: MSK: tender to deep palpation of left trochanter.   Pain elicited with external rotation >>internl rotation of hip  Lymph nodes: Cervical, supraclavicular, and axillary nodes normal.  Lab Results  Component Value Date   HGBA1C 5.1 09/06/2017    Lab Results  Component Value Date   CREATININE 0.88 06/19/2020   CREATININE 0.73 10/25/2019   CREATININE 0.97 09/08/2018    Lab Results  Component Value Date   WBC 6.6 10/25/2019   HGB 14.0 10/25/2019   HCT 41.2 10/25/2019   PLT 217 10/25/2019   GLUCOSE 93 06/19/2020   CHOL 229 (H) 10/25/2019   TRIG 122 10/25/2019   HDL 75 10/25/2019   LDLCALC 133 (H) 10/25/2019   ALT 12 06/19/2020   AST 15 06/19/2020   NA 137 06/19/2020   K 4.3 06/19/2020   CL 99 06/19/2020   CREATININE 0.88 06/19/2020   BUN 25 (H) 06/19/2020   CO2 30 06/19/2020   TSH 1.180 10/25/2019   HGBA1C 5.1 09/06/2017    No results found.  Assessment & Plan:   Problem List Items Addressed This Visit      Unprioritized   Chronic left hip pain    Initially diagnosed as trochanteric bursitis by Dr Jefm Bryant but did not improve at all with an I/A injection several years ago.  Plain films today show mild degenerative changes bilaterally. Will refer to [employer].   STARTING DICLOFENAC 75 MG BID,  TYLENOL 2000 MG DAILY AND TRAMADOL PRN SEVERE PAIN       Relevant Medications   traMADol (ULTRAM) 50 MG tablet   diclofenac (VOLTAREN) 75 MG EC tablet   Fatty liver    Diagnosis discussed.  Alcohol use discussed,  treatment discussed, she prefers to defer taking anything other than Vitamin E unless liver enzymes become elevated  Lab Results  Component Value Date   ALT 12 06/19/2020   AST 15 06/19/2020   ALKPHOS 71 06/19/2020   BILITOT 0.4 06/19/2020         Relevant Orders   Comprehensive metabolic panel (Completed)   History of 2019 novel coronavirus disease (COVID-19)    She  recovered without hospitalization and received the [place] vaccination afterward       Other Visit Diagnoses    Chronic hip pain, left    -  Primary   Relevant Medications   traMADol (ULTRAM) 50 MG tablet   diclofenac (VOLTAREN) 75 MG EC tablet   Other Relevant Orders   DG Hip Unilat W OR W/O Pelvis 2-3 Views Left (Completed)   Ambulatory referral to Orthopedic Surgery      I have discontinued Maryiah K. Bonifas "Lori"'s Vitamin D-3, hydrOXYzine, and Calcium-Vit D-Arg-Inos-Silicon (BONE DENSITY PO). I am also having her start on traMADol and diclofenac. Additionally, I am having her maintain her Potassium, Magnesium, PARoxetine, estradiol, linaclotide, TURMERIC PO, docusate sodium, ALPRAZolam, and Vitamin D-Vitamin K (VITAMIN K2-VITAMIN D3 PO).  Meds ordered this encounter  Medications  . traMADol (ULTRAM) 50 MG tablet    Sig: Take 1 tablet (50 mg total) by mouth every 8 (eight) hours as needed for up to 5 days.    Dispense:  15 tablet    Refill:  0  . diclofenac (VOLTAREN) 75 MG EC tablet    Sig: Take 1 tablet (75 mg total) by mouth 2 (two) times daily. As needed for hip pain    Dispense:  60 tablet    Refill:  0    Medications Discontinued During This Encounter  Medication  Reason  . Calcium-Vit D-Arg-Inos-Silicon (BONE DENSITY PO)   . Cholecalciferol (VITAMIN D-3) 5000 UNIT/ML LIQD   . hydrOXYzine (ATARAX/VISTARIL) 25 MG tablet     Follow-up: No follow-ups on file.   Crecencio Mc, MD

## 2020-06-20 DIAGNOSIS — M25552 Pain in left hip: Secondary | ICD-10-CM | POA: Insufficient documentation

## 2020-06-20 DIAGNOSIS — G8929 Other chronic pain: Secondary | ICD-10-CM | POA: Insufficient documentation

## 2020-06-20 LAB — COMPREHENSIVE METABOLIC PANEL
ALT: 12 U/L (ref 0–35)
AST: 15 U/L (ref 0–37)
Albumin: 4.3 g/dL (ref 3.5–5.2)
Alkaline Phosphatase: 71 U/L (ref 39–117)
BUN: 25 mg/dL — ABNORMAL HIGH (ref 6–23)
CO2: 30 mEq/L (ref 19–32)
Calcium: 9.7 mg/dL (ref 8.4–10.5)
Chloride: 99 mEq/L (ref 96–112)
Creatinine, Ser: 0.88 mg/dL (ref 0.40–1.20)
GFR: 65.5 mL/min (ref >60.00)
Glucose, Bld: 93 mg/dL (ref 70–99)
Potassium: 4.3 mEq/L (ref 3.5–5.1)
Sodium: 137 mEq/L (ref 135–145)
Total Bilirubin: 0.4 mg/dL (ref 0.2–1.2)
Total Protein: 7.1 g/dL (ref 6.0–8.3)

## 2020-06-20 NOTE — Assessment & Plan Note (Addendum)
Initially diagnosed as trochanteric bursitis by Dr Jefm Bryant but did not improve at all with an I/A injection several years ago. Plain films today show mild degenerative changes bilaterally. Will refer to [employer].   STARTING DICLOFENAC 75 MG BID,  TYLENOL 2000 MG DAILY AND TRAMADOL PRN SEVERE PAIN

## 2020-06-20 NOTE — Assessment & Plan Note (Signed)
Diagnosis discussed.  Alcohol use discussed,  treatment discussed, she prefers to defer taking anything other than Vitamin E unless liver enzymes become elevated  Lab Results  Component Value Date   ALT 12 06/19/2020   AST 15 06/19/2020   ALKPHOS 71 06/19/2020   BILITOT 0.4 06/19/2020

## 2020-06-20 NOTE — Assessment & Plan Note (Signed)
She recovered without hospitalization and received the [place] vaccination afterward

## 2020-06-25 ENCOUNTER — Ambulatory Visit: Payer: No Typology Code available for payment source | Admitting: Internal Medicine

## 2021-03-04 ENCOUNTER — Other Ambulatory Visit: Payer: Self-pay | Admitting: Internal Medicine

## 2021-03-04 ENCOUNTER — Other Ambulatory Visit: Payer: Self-pay

## 2021-03-04 MED ORDER — ALPRAZOLAM 0.5 MG PO TABS
ORAL_TABLET | ORAL | 0 refills | Status: DC
  Filled 2021-03-04 – 2021-03-28 (×2): qty 20, 20d supply, fill #0

## 2021-03-04 NOTE — Telephone Encounter (Signed)
RX Refill:xanax Last Seen:06-19-20 Last ordered:11-06-20

## 2021-03-05 ENCOUNTER — Other Ambulatory Visit: Payer: Self-pay | Admitting: Internal Medicine

## 2021-03-05 DIAGNOSIS — Z1231 Encounter for screening mammogram for malignant neoplasm of breast: Secondary | ICD-10-CM

## 2021-03-11 ENCOUNTER — Other Ambulatory Visit: Payer: Self-pay

## 2021-03-11 ENCOUNTER — Ambulatory Visit
Admission: RE | Admit: 2021-03-11 | Discharge: 2021-03-11 | Disposition: A | Discharge status: Home / Self Care | Payer: No Typology Code available for payment source | Source: Ambulatory Visit | Attending: Internal Medicine | Admitting: Internal Medicine

## 2021-03-11 DIAGNOSIS — Z1231 Encounter for screening mammogram for malignant neoplasm of breast: Secondary | ICD-10-CM | POA: Insufficient documentation

## 2021-03-17 ENCOUNTER — Other Ambulatory Visit: Payer: Self-pay

## 2021-03-28 ENCOUNTER — Other Ambulatory Visit: Payer: Self-pay

## 2021-04-23 ENCOUNTER — Ambulatory Visit (INDEPENDENT_AMBULATORY_CARE_PROVIDER_SITE_OTHER): Payer: No Typology Code available for payment source | Admitting: Internal Medicine

## 2021-04-23 ENCOUNTER — Other Ambulatory Visit: Payer: Self-pay

## 2021-04-23 ENCOUNTER — Telehealth: Payer: Self-pay | Admitting: *Deleted

## 2021-04-23 ENCOUNTER — Encounter: Payer: Self-pay | Admitting: Internal Medicine

## 2021-04-23 VITALS — BP 152/82 | HR 58 | Temp 97.6°F | Ht 63.0 in | Wt 147.4 lb

## 2021-04-23 DIAGNOSIS — E673 Hypervitaminosis D: Secondary | ICD-10-CM | POA: Diagnosis not present

## 2021-04-23 DIAGNOSIS — K760 Fatty (change of) liver, not elsewhere classified: Secondary | ICD-10-CM

## 2021-04-23 DIAGNOSIS — Z0000 Encounter for general adult medical examination without abnormal findings: Secondary | ICD-10-CM | POA: Diagnosis not present

## 2021-04-23 DIAGNOSIS — E785 Hyperlipidemia, unspecified: Secondary | ICD-10-CM

## 2021-04-23 DIAGNOSIS — E559 Vitamin D deficiency, unspecified: Secondary | ICD-10-CM

## 2021-04-23 DIAGNOSIS — R030 Elevated blood-pressure reading, without diagnosis of hypertension: Secondary | ICD-10-CM

## 2021-04-23 LAB — LIPID PANEL
Cholesterol: 273 mg/dL — ABNORMAL HIGH (ref 0–200)
HDL: 86.8 mg/dL (ref >39.00)
LDL Cholesterol: 167 mg/dL — ABNORMAL HIGH (ref 0–99)
NonHDL: 185.79
Total CHOL/HDL Ratio: 3
Triglycerides: 95 mg/dL (ref 0.0–149.0)
VLDL: 19 mg/dL (ref 0.0–40.0)

## 2021-04-23 LAB — COMPREHENSIVE METABOLIC PANEL
ALT: 26 U/L (ref 0–35)
AST: 22 U/L (ref 0–37)
Albumin: 4.3 g/dL (ref 3.5–5.2)
Alkaline Phosphatase: 68 U/L (ref 39–117)
BUN: 20 mg/dL (ref 6–23)
CO2: 29 mEq/L (ref 19–32)
Calcium: 9.3 mg/dL (ref 8.4–10.5)
Chloride: 97 mEq/L (ref 96–112)
Creatinine, Ser: 0.76 mg/dL (ref 0.40–1.20)
GFR: 84.8 mL/min (ref >60.00)
Glucose, Bld: 85 mg/dL (ref 70–99)
Potassium: 4.2 mEq/L (ref 3.5–5.1)
Sodium: 135 mEq/L (ref 135–145)
Total Bilirubin: 0.7 mg/dL (ref 0.2–1.2)
Total Protein: 7.3 g/dL (ref 6.0–8.3)

## 2021-04-23 LAB — MICROALBUMIN / CREATININE URINE RATIO
Creatinine,U: 9.6 mg/dL
Microalb Creat Ratio: 7.3 mg/g (ref 0.0–30.0)
Microalb, Ur: <0.7 mg/dL (ref 0.0–1.9)

## 2021-04-23 LAB — VITAMIN D 25 HYDROXY (VIT D DEFICIENCY, FRACTURES): VITD: >120 ng/mL

## 2021-04-23 LAB — HEMOGLOBIN A1C: Hgb A1c MFr Bld: 5.3 % (ref 4.6–6.5)

## 2021-04-23 MED ORDER — ESTRADIOL 0.1 MG/24HR TD PTTW
MEDICATED_PATCH | TRANSDERMAL | 11 refills | Status: DC
  Filled 2021-04-23 – 2021-05-15 (×2): qty 24, 84d supply, fill #0

## 2021-04-23 NOTE — Patient Instructions (Signed)
Check bp at home once daily 5 times over the next 2 weeks and send me the readings.  Take your bp machine to work and make sure it is accurate   Health Maintenance for Postmenopausal Women Menopause is a normal process in which your ability to get pregnant comes to an end. This process happens slowly over many months or years, usually between the ages of 58 and 15. Menopause is complete when you have missed your menstrualperiods for 12 months. It is important to talk with your health care provider about some of the most common conditions that affect women after menopause (postmenopausal women). These include heart disease, cancer, and bone loss (osteoporosis). Adopting a healthy lifestyle and getting preventive care can help to promote your health and wellness. The actions you take can also lower your chances ofdeveloping some of these common conditions. What should I know about menopause? During menopause, you may get a number of symptoms, such as: Hot flashes. These can be moderate or severe. Night sweats. Decrease in sex drive. Mood swings. Headaches. Tiredness. Irritability. Memory problems. Insomnia. Choosing to treat or not to treat these symptoms is a decision that you makewith your health care provider. Do I need hormone replacement therapy? Hormone replacement therapy is effective in treating symptoms that are caused by menopause, such as hot flashes and night sweats. Hormone replacement carries certain risks, especially as you become older. If you are thinking about using estrogen or estrogen with progestin, discuss the benefits and risks with your health care provider. What is my risk for heart disease and stroke? The risk of heart disease, heart attack, and stroke increases as you age. One of the causes may be a change in the body's hormones during menopause. This can affect how your body uses dietary fats, triglycerides, and cholesterol. Heart attack and stroke are medical  emergencies. There are many things that you cando to help prevent heart disease and stroke. Watch your blood pressure High blood pressure causes heart disease and increases the risk of stroke. This is more likely to develop in people who have high blood pressure readings, are of African descent, or are overweight. Have your blood pressure checked: Every 3-5 years if you are 20-42 years of age. Every year if you are 47 years old or older. Eat a healthy diet  Eat a diet that includes plenty of vegetables, fruits, low-fat dairy products, and lean protein. Do not eat a lot of foods that are high in solid fats, added sugars, or sodium.  Get regular exercise Get regular exercise. This is one of the most important things you can do for your health. Most adults should: Try to exercise for at least 150 minutes each week. The exercise should increase your heart rate and make you sweat (moderate-intensity exercise). Try to do strengthening exercises at least twice each week. Do these in addition to the moderate-intensity exercise. Spend less time sitting. Even light physical activity can be beneficial. Other tips Work with your health care provider to achieve or maintain a healthy weight. Do not use any products that contain nicotine or tobacco, such as cigarettes, e-cigarettes, and chewing tobacco. If you need help quitting, ask your health care provider. Know your numbers. Ask your health care provider to check your cholesterol and your blood sugar (glucose). Continue to have your blood tested as directed by your health care provider. Do I need screening for cancer? Depending on your health history and family history, you may need to have cancer screening  at different stages of your life. This may include screening for: Breast cancer. Cervical cancer. Lung cancer. Colorectal cancer. What is my risk for osteoporosis? After menopause, you may be at increased risk for osteoporosis. Osteoporosis is a  condition in which bone destruction happens more quickly than new bone creation. To help prevent osteoporosis or the bone fractures that can happen because of osteoporosis, you may take the following actions: If you are 4-7 years old, get at least 1,000 mg of calcium and at least 600 mg of vitamin D per day. If you are older than age 44 but younger than age 73, get at least 1,200 mg of calcium and at least 600 mg of vitamin D per day. If you are older than age 80, get at least 1,200 mg of calcium and at least 800 mg of vitamin D per day. Smoking and drinking excessive alcohol increase the risk of osteoporosis. Eat foods that are rich in calcium and vitamin D, and do weight-bearing exercisesseveral times each week as directed by your health care provider. How does menopause affect my mental health? Depression may occur at any age, but it is more common as you become older. Common symptoms of depression include: Low or sad mood. Changes in sleep patterns. Changes in appetite or eating patterns. Feeling an overall lack of motivation or enjoyment of activities that you previously enjoyed. Frequent crying spells. Talk with your health care provider if you think that you are experiencingdepression. General instructions See your health care provider for regular wellness exams and vaccines. This may include: Scheduling regular health, dental, and eye exams. Getting and maintaining your vaccines. These include: Influenza vaccine. Get this vaccine each year before the flu season begins. Pneumonia vaccine. Shingles vaccine. Tetanus, diphtheria, and pertussis (Tdap) booster vaccine. Your health care provider may also recommend other immunizations. Tell your health care provider if you have ever been abused or do not feel safeat home. Summary Menopause is a normal process in which your ability to get pregnant comes to an end. This condition causes hot flashes, night sweats, decreased interest in sex,  mood swings, headaches, or lack of sleep. Treatment for this condition may include hormone replacement therapy. Take actions to keep yourself healthy, including exercising regularly, eating a healthy diet, watching your weight, and checking your blood pressure and blood sugar levels. Get screened for cancer and depression. Make sure that you are up to date with all your vaccines. This information is not intended to replace advice given to you by your health care provider. Make sure you discuss any questions you have with your healthcare provider. Document Revised: 10/12/2018 Document Reviewed: 10/12/2018 Elsevier Patient Education  2022 [employer].

## 2021-04-23 NOTE — Telephone Encounter (Signed)
Spoke with pt and informed her of her lab result. Pt was also informed that she needs to stop taking all vitamin d supplements. Pt gave a verbal understanding.

## 2021-04-23 NOTE — Telephone Encounter (Signed)
CRITICAL VALUE STICKER  CRITICAL VALUE: Vitamin D: < 120 (H)  RECEIVER (on-site recipient of call): Jari Favre, CMA/XT  DATE & TIME NOTIFIED:  3:08pm  MESSENGER (representative from lab):[place]  MD NOTIFIED: [place]  TIME OF NOTIFICATION:3:10pm  RESPONSE:  see note

## 2021-04-23 NOTE — Progress Notes (Signed)
Patient ID: [name], female    DOB: [birth year]  Age: 60 y.o. MRN: [id number]  The patient is here for annual preventive examination and management of other chronic and acute problems.   The risk factors are reflected in the social history.  The roster of all physicians providing medical care to patient - is listed in the Snapshot section of the chart.  Activities of daily living:  The patient is 100% independent in all ADLs: dressing, toileting, feeding as well as independent mobility  Home safety : The patient has smoke detectors in the home. They wear seatbelts.  There are no firearms at home. There is no violence in the home.   There is no risks for hepatitis, STDs or HIV. There is no   history of blood transfusion. They have no travel history to infectious disease endemic areas of the world.  The patient has seen their dentist in the last six month. They have seen their eye doctor in the last year. She denies  hearing difficulty with regard to whispered voices and some television programs.  They have deferred audiologic testing in the last year.  They do not  have excessive sun exposure. Discussed the need for sun protection: hats, long sleeves and use of sunscreen if there is significant sun exposure.   Diet: the importance of a healthy diet is discussed. They do have a healthy diet.  The benefits of regular aerobic exercise were discussed. She walks 4 times per week ,  20 minutes.   Depression screen: there are no signs or vegative symptoms of depression- irritability, change in appetite, anhedonia, sadness/tearfullness.  Cognitive assessment: the patient manages all their financial and personal affairs and is actively engaged. They could relate day,date,year and events; recalled 2/3 objects at 3 minutes; performed clock-face test normally.  The following portions of the patient's history were reviewed and updated as appropriate: allergies, current medications, past family history,  past medical history,  past surgical history, past social history  and problem list.  Visual acuity was not assessed per patient preference since she has regular follow up with her ophthalmologist. Hearing and body mass index were assessed and reviewed.   During the course of the visit the patient was educated and counseled about appropriate screening and preventive services including : fall prevention , diabetes screening, nutrition counseling, colorectal cancer screening, and recommended immunizations.    CC: The primary encounter diagnosis was Fatty liver. Diagnoses of Hyperlipidemia with target LDL less than 160, Elevated blood pressure reading, Vitamin D deficiency, Hypervitaminosis D, Encounter for preventive health examination, and Elevated blood pressure reading in office with white coat syndrome, without diagnosis of hypertension were also pertinent to this visit.  1) overweight:  lost 9 lbs using the "metabolic Reset" diet  by Dr Hassell Done (PhD, not MD) from the [facility] (diet for diabetics) ate nothing but  eggs, meat and cheese  for a solid month  starting in January;  Felt rough for the first week, but since then has felt great.  Still avoids all carbs .  Only lettuce, celery .  Her brother dropped 60 lbs.   Her goal is 135 lbs.   Taking b12,  Vit D and DHA .   Not exercising,  but does yard work and walks .  Not constipated despite lack of fiber in diet and h/o chronic constipation .  Drinks > 60 ounces of water with [place] salt added.  Does not have gas or constipation.  2) Menopause:  unable to wean from estrogen patch due to recurrent hot flashes  3) Joint pain:  pain has resolved on this diet.  Has increased energy since giving up sugar and gluten .    4) Social:  only drinks on the weekend nights.  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   She has a past surgical history that includes Nephrectomy living donor (Left, 2011); Laparoscopic vaginal hysterectomy with salpingo oophorectomy (Bilateral, 04-05-2008  dr Phineas Real at [facility] Colonoscopy (last one 09-18-2013); Tonsillectomy (age 60); DX LAPAROSCOPY W/ FULGERATION ENDOMETRIOSIS (1993;  1995); Dilation and curettage of uterus (1993); Hemorrhoid surgery (N/A, 06/04/2017); Colonoscopy with propofol (N/A, 09/25/2019); Cataract extraction w/PHACO (Right, 04/02/2020); and Cataract extraction w/PHACO (Left, 04/23/2020).   Her family history includes Breast cancer in her cousin and paternal aunt; Cancer in her paternal aunt and sister; Cancer (age of onset: 58) in her sister; Cancer (age of onset: 29) in her sister; Hearing loss in her daughter; Mental illness (age of onset: 15) in her father.She reports that she has never smoked. She has never used smokeless tobacco. She reports current alcohol use of about 5.0 standard drinks of alcohol per week. She reports that she does not use drugs.  Outpatient Medications Prior to Visit  Medication Sig Dispense Refill  . ALPRAZolam (XANAX) 0.5 MG tablet TAKE 1 TABLET BY MOUTH AT BEDTIME AS NEEDED FOR ANXIETY. 20 tablet 0  . docusate sodium (COLACE) 100 MG capsule Take 100 mg by mouth daily as needed for mild constipation.    . Magnesium 400 MG TABS Take 1 tablet by mouth 2 (two) times daily.    Marland Kitchen PARoxetine (PAXIL) 20 MG tablet Take 1 tablet (20 mg total) by mouth every  morning. 90 tablet 1  . Potassium 99 MG TABS Take 1 tablet by mouth 2 (two) times daily.     . Vitamin D-Vitamin K (VITAMIN K2-VITAMIN D3 PO) Take 1 capsule by mouth daily.    Marland Kitchen estradiol (VIVELLE-DOT) 0.1 MG/24HR patch PLACE 1 PATCH ONTO THE SKIN 2 TIMES A WEEK 24 patch 11  . diclofenac (VOLTAREN) 75 MG EC tablet Take 1 tablet (75 mg total) by mouth 2 (two) times daily. As needed for hip pain (Patient not taking: Reported on 04/23/2021) 60 tablet 0  . linaclotide (LINZESS) 290 MCG CAPS capsule Take 1 capsule (290 mcg total) by mouth daily before breakfast. (Patient not taking: Reported on 04/23/2021) 90 capsule 0  . TURMERIC PO Take by mouth daily. (Patient not taking: Reported on 04/23/2021)     No facility-administered medications prior to visit.    Review of Systems  Patient denies headache, fevers, malaise, unintentional weight loss, skin rash, eye pain, sinus congestion and sinus pain, sore throat, dysphagia,  hemoptysis , cough, dyspnea, wheezing, chest pain, palpitations, orthopnea, edema, abdominal pain, nausea, melena, diarrhea, constipation, flank pain, dysuria, hematuria, urinary  Frequency, nocturia, numbness, tingling, seizures,  Focal weakness, Loss of consciousness,  Tremor, insomnia,  depression, anxiety, and suicidal ideation.     Objective:  BP (!) 152/82 (BP Location: Left Arm, Patient Position: Sitting, Cuff Size: Normal)   Pulse (!) 58   Temp 97.6 F (36.4 C) (Oral)   Ht 5\' 3"  (1.6 m)   Wt 147 lb 6.4 oz (66.9 kg)   SpO2 99%   BMI 26.11 kg/m   Physical Exam  General appearance: alert, cooperative and appears stated age Ears: normal TM's and external ear canals both ears Throat: lips, mucosa, and tongue normal; teeth and gums normal Neck: no adenopathy, no carotid bruit, supple, symmetrical, trachea midline and thyroid not enlarged, symmetric, no tenderness/mass/nodules Back: symmetric, no curvature. ROM normal. No CVA tenderness. Lungs: clear to auscultation  bilaterally Heart: regular rate and rhythm, S1, S2 normal, no murmur, click, rub or gallop Abdomen: soft, non-tender; bowel sounds normal; no masses,  no organomegaly Pulses: 2+ and symmetric Skin: Skin color, texture, turgor normal. No rashes or lesions Lymph nodes: Cervical, supraclavicular, and axillary nodes normal.    Assessment & Plan:   Problem List Items Addressed This Visit       Unprioritized   Elevated blood pressure reading in office with white coat syndrome, without diagnosis of hypertension    Home reading was recently 119 systolic.  Advised to check BP at home over the next 2 weeks and advise office.  Lytes, cr are normal and she has no proteinuria   Lab Results  Component Value Date   CREATININE 0.76 04/23/2021   Lab Results  Component Value Date   NA 135 04/23/2021   K 4.2 04/23/2021   CL 97 04/23/2021   CO2 29 04/23/2021   Lab Results  Component Value Date   MICROALBUR <0.7 04/23/2021            Encounter for preventive health examination    age appropriate education and counseling updated, referrals for preventative services and immunizations addressed, dietary and smoking counseling addressed, most recent labs reviewed.  I have personally reviewed and have noted:   1) the patient's medical and social history 2) The pt's use of alcohol, tobacco, and illicit drugs 3) The patient's current medications and supplements 4) Functional ability including ADL's, fall risk, home safety risk, hearing and visual impairment 5) Diet and physical activities 6) Evidence for depression or mood disorder 7) The patient's height, weight, and BMI have been recorded in the chart   I have made referrals, and provided counseling and education based on review of the above       Fatty liver - Primary   Relevant Orders   Comprehensive metabolic panel (Completed)   Hemoglobin A1c (Completed)   Hyperlipidemia with target LDL less than 160    LDL is unchanged ,  triglycerides are lower and HDL is higher on an ultra low carb diet.   10 yr is is < 5%  Lab Results  Component Value Date   CHOL 273 (H) 04/23/2021   HDL 86.80 04/23/2021   LDLCALC 167 (H) 04/23/2021   TRIG 95.0 04/23/2021   CHOLHDL 3 04/23/2021          Relevant Orders   Lipid panel (Completed)   Hypervitaminosis D    Unexpexted,  Likely due to high protein diet and supplements.  Stop supplements. Recheck in 4 weeks with PTH       Relevant Orders   VITAMIN D 25 Hydroxy (Vit-D Deficiency, Fractures)   PTH, Intact and Calcium   Other Visit Diagnoses  Elevated blood pressure reading       Relevant Orders   Microalbumin / creatinine urine ratio (Completed)   Vitamin D deficiency       Relevant Orders   VITAMIN D 25 Hydroxy (Vit-D Deficiency, Fractures) (Completed)       I am having Mertie K. Micalizzi "Lori" maintain her Potassium, Magnesium, PARoxetine, linaclotide, TURMERIC PO, docusate sodium, Vitamin D-Vitamin K (VITAMIN K2-VITAMIN D3 PO), diclofenac, and ALPRAZolam.  No orders of the defined types were placed in this encounter.   There are no discontinued medications.  Follow-up: No follow-ups on file.   Crecencio Mc, MD

## 2021-04-24 DIAGNOSIS — E673 Hypervitaminosis D: Secondary | ICD-10-CM | POA: Insufficient documentation

## 2021-04-24 DIAGNOSIS — R030 Elevated blood-pressure reading, without diagnosis of hypertension: Secondary | ICD-10-CM | POA: Insufficient documentation

## 2021-04-24 NOTE — Assessment & Plan Note (Addendum)
LDL is unchanged , triglycerides are lower and HDL is higher on an ultra low carb diet.   10 yr is is < 5%  Lab Results  Component Value Date   CHOL 273 (H) 04/23/2021   HDL 86.80 04/23/2021   LDLCALC 167 (H) 04/23/2021   TRIG 95.0 04/23/2021   CHOLHDL 3 04/23/2021

## 2021-04-24 NOTE — Assessment & Plan Note (Signed)
Unexpexted,  Likely due to high protein diet and supplements.  Stop supplements. Recheck in 4 weeks with PTH

## 2021-04-24 NOTE — Assessment & Plan Note (Signed)

## 2021-04-24 NOTE — Assessment & Plan Note (Signed)
Home reading was recently 436 systolic.  Advised to check BP at home over the next 2 weeks and advise office.  Lytes, cr are normal and she has no proteinuria   Lab Results  Component Value Date   CREATININE 0.76 04/23/2021   Lab Results  Component Value Date   NA 135 04/23/2021   K 4.2 04/23/2021   CL 97 04/23/2021   CO2 29 04/23/2021   Lab Results  Component Value Date   MICROALBUR <0.7 04/23/2021

## 2021-05-07 ENCOUNTER — Other Ambulatory Visit: Payer: Self-pay

## 2021-05-15 ENCOUNTER — Other Ambulatory Visit: Payer: Self-pay

## 2021-09-24 ENCOUNTER — Other Ambulatory Visit: Payer: Self-pay

## 2021-09-24 ENCOUNTER — Other Ambulatory Visit: Payer: Self-pay | Admitting: Internal Medicine

## 2021-09-24 MED ORDER — PAROXETINE HCL 20 MG PO TABS
ORAL_TABLET | ORAL | 1 refills | Status: DC
  Filled 2021-09-24: qty 90, 90d supply, fill #0
  Filled 2022-08-17 – 2022-09-09 (×2): qty 90, 90d supply, fill #1

## 2021-10-21 ENCOUNTER — Other Ambulatory Visit: Payer: Self-pay

## 2021-10-21 MED ORDER — CARESTART COVID-19 HOME TEST VI KIT
PACK | 0 refills | Status: DC
  Filled 2021-10-21: qty 2, 4d supply, fill #0

## 2021-12-03 ENCOUNTER — Other Ambulatory Visit: Payer: Self-pay

## 2021-12-03 ENCOUNTER — Other Ambulatory Visit: Payer: Self-pay | Admitting: Internal Medicine

## 2021-12-03 MED ORDER — ALPRAZOLAM 0.5 MG PO TABS
ORAL_TABLET | ORAL | 0 refills | Status: DC
  Filled 2021-12-03: qty 20, 20d supply, fill #0

## 2021-12-03 NOTE — Telephone Encounter (Signed)
Okay to refill?  LOV: 04/23/21 NOV: 04/29/22

## 2021-12-04 ENCOUNTER — Other Ambulatory Visit: Payer: Self-pay

## 2022-03-24 ENCOUNTER — Other Ambulatory Visit: Payer: Self-pay | Admitting: Internal Medicine

## 2022-03-25 ENCOUNTER — Other Ambulatory Visit: Payer: Self-pay

## 2022-03-25 MED ORDER — ALPRAZOLAM 0.5 MG PO TABS
ORAL_TABLET | ORAL | 0 refills | Status: DC
  Filled 2022-03-25: qty 20, 20d supply, fill #0

## 2022-03-25 NOTE — Telephone Encounter (Signed)
Refilled: 12/03/2021 Last OV: 04/23/2021 Next OV: 04/29/2022

## 2022-04-29 ENCOUNTER — Other Ambulatory Visit: Payer: Self-pay | Admitting: Internal Medicine

## 2022-04-29 ENCOUNTER — Encounter: Payer: No Typology Code available for payment source | Admitting: Internal Medicine

## 2022-04-29 DIAGNOSIS — Z1231 Encounter for screening mammogram for malignant neoplasm of breast: Secondary | ICD-10-CM

## 2022-05-18 ENCOUNTER — Other Ambulatory Visit: Payer: Self-pay

## 2022-05-18 ENCOUNTER — Ambulatory Visit (INDEPENDENT_AMBULATORY_CARE_PROVIDER_SITE_OTHER): Payer: No Typology Code available for payment source | Admitting: Internal Medicine

## 2022-05-18 ENCOUNTER — Encounter: Payer: Self-pay | Admitting: Internal Medicine

## 2022-05-18 VITALS — BP 140/72 | HR 68 | Temp 97.6°F | Ht 63.0 in | Wt 138.4 lb

## 2022-05-18 DIAGNOSIS — L299 Pruritus, unspecified: Secondary | ICD-10-CM | POA: Insufficient documentation

## 2022-05-18 DIAGNOSIS — Z0000 Encounter for general adult medical examination without abnormal findings: Secondary | ICD-10-CM

## 2022-05-18 DIAGNOSIS — E673 Hypervitaminosis D: Secondary | ICD-10-CM

## 2022-05-18 DIAGNOSIS — E663 Overweight: Secondary | ICD-10-CM

## 2022-05-18 DIAGNOSIS — N951 Menopausal and female climacteric states: Secondary | ICD-10-CM

## 2022-05-18 DIAGNOSIS — E785 Hyperlipidemia, unspecified: Secondary | ICD-10-CM | POA: Diagnosis not present

## 2022-05-18 DIAGNOSIS — Z79890 Hormone replacement therapy (postmenopausal): Secondary | ICD-10-CM

## 2022-05-18 DIAGNOSIS — E041 Nontoxic single thyroid nodule: Secondary | ICD-10-CM | POA: Diagnosis not present

## 2022-05-18 DIAGNOSIS — K760 Fatty (change of) liver, not elsewhere classified: Secondary | ICD-10-CM | POA: Diagnosis not present

## 2022-05-18 LAB — TSH: TSH: 1.14 u[IU]/mL (ref 0.35–5.50)

## 2022-05-18 LAB — CBC WITH DIFFERENTIAL/PLATELET
Basophils Absolute: 0 10*3/uL (ref 0.0–0.1)
Basophils Relative: 0.5 % (ref 0.0–3.0)
Eosinophils Absolute: 0.1 10*3/uL (ref 0.0–0.7)
Eosinophils Relative: 1.9 % (ref 0.0–5.0)
HCT: 40.3 % (ref 36.0–46.0)
Hemoglobin: 13.7 g/dL (ref 12.0–15.0)
Lymphocytes Relative: 34.1 % (ref 12.0–46.0)
Lymphs Abs: 1.7 10*3/uL (ref 0.7–4.0)
MCHC: 34 g/dL (ref 30.0–36.0)
MCV: 93 fl (ref 78.0–100.0)
Monocytes Absolute: 0.4 10*3/uL (ref 0.1–1.0)
Monocytes Relative: 8.2 % (ref 3.0–12.0)
Neutro Abs: 2.8 10*3/uL (ref 1.4–7.7)
Neutrophils Relative %: 55.3 % (ref 43.0–77.0)
Platelets: 184 10*3/uL (ref 150.0–400.0)
RBC: 4.34 Mil/uL (ref 3.87–5.11)
RDW: 14.4 % (ref 11.5–15.5)
WBC: 5 10*3/uL (ref 4.0–10.5)

## 2022-05-18 LAB — COMPREHENSIVE METABOLIC PANEL
ALT: 16 U/L (ref 0–35)
AST: 17 U/L (ref 0–37)
Albumin: 4.3 g/dL (ref 3.5–5.2)
Alkaline Phosphatase: 65 U/L (ref 39–117)
BUN: 16 mg/dL (ref 6–23)
CO2: 30 mEq/L (ref 19–32)
Calcium: 9.3 mg/dL (ref 8.4–10.5)
Chloride: 96 mEq/L (ref 96–112)
Creatinine, Ser: 0.76 mg/dL (ref 0.40–1.20)
GFR: 84.17 mL/min (ref >60.00)
Glucose, Bld: 86 mg/dL (ref 70–99)
Potassium: 4.5 mEq/L (ref 3.5–5.1)
Sodium: 135 mEq/L (ref 135–145)
Total Bilirubin: 0.6 mg/dL (ref 0.2–1.2)
Total Protein: 6.8 g/dL (ref 6.0–8.3)

## 2022-05-18 LAB — LIPID PANEL
Cholesterol: 267 mg/dL — ABNORMAL HIGH (ref 0–200)
HDL: 97.5 mg/dL (ref >39.00)
LDL Cholesterol: 150 mg/dL — ABNORMAL HIGH (ref 0–99)
NonHDL: 169.69
Total CHOL/HDL Ratio: 3
Triglycerides: 97 mg/dL (ref 0.0–149.0)
VLDL: 19.4 mg/dL (ref 0.0–40.0)

## 2022-05-18 MED ORDER — IPRATROPIUM BROMIDE 0.03 % NA SOLN
2.0000 | Freq: Two times a day (BID) | NASAL | 12 refills | Status: DC
  Filled 2022-05-18: qty 30, 30d supply, fill #0

## 2022-05-18 MED ORDER — TETANUS-DIPHTH-ACELL PERTUSSIS 5-2.5-18.5 LF-MCG/0.5 IM SUSY: 0.5000 mL | PREFILLED_SYRINGE | Freq: Once | INTRAMUSCULAR | 0 refills | Status: AC

## 2022-05-18 MED ORDER — ALPRAZOLAM 0.5 MG PO TABS
ORAL_TABLET | ORAL | 5 refills | Status: DC
  Filled 2022-05-18: qty 15, 15d supply, fill #0
  Filled 2022-08-17 – 2022-09-09 (×2): qty 15, 15d supply, fill #1

## 2022-05-18 NOTE — Assessment & Plan Note (Signed)
Etiology unclear. LFTs are normal  And she has no eosinophilia,  Advised to keep a food diary   Lab Results  Component Value Date   ALT 16 05/18/2022   AST 17 05/18/2022   ALKPHOS 65 05/18/2022   BILITOT 0.6 05/18/2022   Lab Results  Component Value Date   WBC 5.0 05/18/2022   HGB 13.7 05/18/2022   HCT 40.3 05/18/2022   MCV 93.0 05/18/2022   PLT 184.0 05/18/2022

## 2022-05-18 NOTE — Assessment & Plan Note (Signed)
She has lost weight using an ultra low carb diet.

## 2022-05-18 NOTE — Assessment & Plan Note (Signed)
LDL is at goal on low glycemic index diet    Lab Results  Component Value Date   CHOL 267 (H) 05/18/2022   HDL 97.50 05/18/2022   LDLCALC 150 (H) 05/18/2022   TRIG 97.0 05/18/2022   CHOLHDL 3 05/18/2022

## 2022-05-18 NOTE — Assessment & Plan Note (Signed)
Managed with ultra low carb diet and weight loss.  Liver enzymes are normal   Lab Results  Component Value Date   ALT 16 05/18/2022   AST 17 05/18/2022   ALKPHOS 65 05/18/2022   BILITOT 0.6 05/18/2022

## 2022-05-18 NOTE — Assessment & Plan Note (Signed)

## 2022-05-18 NOTE — Assessment & Plan Note (Signed)
She did not return for repeat level last year .  Repeat level is needed

## 2022-05-18 NOTE — Assessment & Plan Note (Signed)
Hot flashes have become more intense since stopping HRT

## 2022-05-18 NOTE — Patient Instructions (Signed)
For the itching:  Ok to increase antihistamine  to every 6 to 12 hours  Checking liver enzymes today  Try keeping a food diary (may be a food allergy)   For the sleep issues:  No risk of addiction if alprazolam use is limited to every other day   For the hot flashes:  Exercise helps Try hormone free beef (if available)

## 2022-05-18 NOTE — Progress Notes (Signed)
The patient is here for annual preventive examination and management of other chronic and acute problems.   The risk factors are reflected in the social history.   The roster of all physicians providing medical care to patient - is listed in the Snapshot section of the chart.   Activities of daily living:  The patient is 100% independent in all ADLs: dressing, toileting, feeding as well as independent mobility   Home safety : The patient has smoke detectors in the home. They wear seatbelts.  There are no unsecured firearms at home. There is no violence in the home.    There is no risks for hepatitis, STDs or HIV. There is no   history of blood transfusion. They have no travel history to infectious disease endemic areas of the world.   The patient has seen their dentist in the last six month. They have seen their eye doctor in the last year. The patinet  denies slight hearing difficulty with regard to whispered voices and some television programs.  They have deferred audiologic testing in the last year.  They do not  have excessive sun exposure. Discussed the need for sun protection: hats, long sleeves and use of sunscreen if there is significant sun exposure.    Diet: the importance of a healthy diet is discussed. She is following an ultras low carb diet (eggs, meat cheese ,  occasional fruit,  no veggies)  t.   The benefits of regular aerobic exercise were discussed. The patient  exercises  3 to 5 days per week  for  60 minutes.    Depression screen: there are no signs or vegative symptoms of depression- irritability, change in appetite, anhedonia, sadness/tearfullness.   The following portions of the patient's history were reviewed and updated as appropriate: allergies, current medications, past family history, past medical history,  past surgical history, past social history  and problem list.   Visual acuity was not assessed per patient preference since the patient has regular follow up  with an  ophthalmologist. Hearing and body mass index were assessed and reviewed.    During the course of the visit the patient was educated and counseled about appropriate screening and preventive services including : fall prevention , diabetes screening, nutrition counseling, colorectal cancer screening, and recommended immunizations.    Chief Complaint:  INSOMNIA DUE TO HOT FLASHES.      Review of Symptoms  Patient denies headache, fevers, malaise, unintentional weight loss, skin rash, eye pain, sinus congestion and sinus pain, sore throat, dysphagia,  hemoptysis , cough, dyspnea, wheezing, chest pain, palpitations, orthopnea, edema, abdominal pain, nausea, melena, diarrhea, constipation, flank pain, dysuria, hematuria, urinary  Frequency, nocturia, numbness, tingling, seizures,  Focal weakness, Loss of consciousness,  Tremor, insomnia, depression, anxiety, and suicidal ideation.    Physical Exam:  BP 140/72 (BP Location: Left Arm, Patient Position: Sitting, Cuff Size: Normal)   Pulse 68   Temp 97.6 F (36.4 C) (Oral)   Ht '5\' 3"'  (1.6 m)   Wt 138 lb 6.4 oz (62.8 kg)   SpO2 98%   BMI 24.52 kg/m    General appearance: alert, cooperative and appears stated age Head: Normocephalic, without obvious abnormality, atraumatic Eyes: conjunctivae/corneas clear. PERRL, EOM's intact. Fundi benign. Ears: normal TM's and external ear canals both ears Nose: Nares normal. Septum midline. Mucosa normal. No drainage or sinus tenderness. Throat: lips, mucosa, and tongue normal; teeth and gums normal Neck: no adenopathy, no carotid bruit, no JVD, supple, symmetrical, trachea midline and  thyroid not enlarged, symmetric, no tenderness/mass/nodules Lungs: clear to auscultation bilaterally Breasts: normal appearance, no masses or tenderness Heart: regular rate and rhythm, S1, S2 normal, no murmur, click, rub or gallop Abdomen: soft, non-tender; bowel sounds normal; no masses,  no  organomegaly Extremities: extremities normal, atraumatic, no cyanosis or edema Pulses: 2+ and symmetric Skin: Skin color, texture, turgor normal. No rashes or lesions Neurologic: Alert and oriented X 3, normal strength and tone. Normal symmetric reflexes. Normal coordination and gait.      Assessment and Plan:  Fatty liver Managed with ultra low carb diet and weight loss.  Liver enzymes are normal   Lab Results  Component Value Date   ALT 16 05/18/2022   AST 17 05/18/2022   ALKPHOS 65 05/18/2022   BILITOT 0.6 05/18/2022     Encounter for preventive health examination age appropriate education and counseling updated, referrals for preventative services and immunizations addressed, dietary and smoking counseling addressed, most recent labs reviewed.  I have personally reviewed and have noted:   1) the patient's medical and social history 2) The pt's use of alcohol, tobacco, and illicit drugs 3) The patient's current medications and supplements 4) Functional ability including ADL's, fall risk, home safety risk, hearing and visual impairment 5) Diet and physical activities 6) Evidence for depression or mood disorder 7) The patient's height, weight, and BMI have been recorded in the chart   I have made referrals, and provided counseling and education based on review of the above  Menopausal syndrome on hormone replacement therapy Hot flashes have become more intense since stopping HRT   Overweight (BMI 25.0-29.9) She has lost weight using an ultra low carb diet.   Hyperlipidemia with target LDL less than 160 LDL is at goal on low glycemic index diet    Lab Results  Component Value Date   CHOL 267 (H) 05/18/2022   HDL 97.50 05/18/2022   LDLCALC 150 (H) 05/18/2022   TRIG 97.0 05/18/2022   CHOLHDL 3 05/18/2022     Hypervitaminosis D She did not return for repeat level last year .  Repeat level is needed   Pruritus Etiology unclear. LFTs are normal  And she has no  eosinophilia,  Advised to keep a food diary   Lab Results  Component Value Date   ALT 16 05/18/2022   AST 17 05/18/2022   ALKPHOS 65 05/18/2022   BILITOT 0.6 05/18/2022   Lab Results  Component Value Date   WBC 5.0 05/18/2022   HGB 13.7 05/18/2022   HCT 40.3 05/18/2022   MCV 93.0 05/18/2022   PLT 184.0 05/18/2022     Updated Medication List Outpatient Encounter Medications as of 05/18/2022  Medication Sig   ipratropium (ATROVENT) 0.03 % nasal spray Place 2 sprays into both nostrils every 12 (twelve) hours.   Tdap (BOOSTRIX) 5-2.5-18.5 LF-MCG/0.5 injection Inject 0.5 mLs into the muscle once for 1 dose.   ALPRAZolam (XANAX) 0.5 MG tablet TAKE 1 TABLET BY MOUTH AT BEDTIME AS NEEDED FOR ANXIETY.   diclofenac (VOLTAREN) 75 MG EC tablet Take 1 tablet (75 mg total) by mouth 2 (two) times daily. As needed for hip pain (Patient not taking: Reported on 04/23/2021)   docusate sodium (COLACE) 100 MG capsule Take 100 mg by mouth daily as needed for mild constipation.   Magnesium 400 MG TABS Take 1 tablet by mouth 2 (two) times daily.   PARoxetine (PAXIL) 20 MG tablet Take 1 tablet (20 mg total) by mouth every morning.   Potassium  99 MG TABS Take 1 tablet by mouth 2 (two) times daily.    Vitamin D-Vitamin K (VITAMIN K2-VITAMIN D3 PO) Take 1 capsule by mouth daily.   [DISCONTINUED] ALPRAZolam (XANAX) 0.5 MG tablet TAKE 1 TABLET BY MOUTH AT BEDTIME AS NEEDED FOR ANXIETY.   [DISCONTINUED] COVID-19 At Home Antigen Test (CARESTART COVID-19 HOME TEST) KIT Use as directed   [DISCONTINUED] estradiol (VIVELLE-DOT) 0.1 MG/24HR patch PLACE 1 PATCH ONTO THE SKIN 2 TIMES A WEEK (Patient not taking: Reported on 05/18/2022)   [DISCONTINUED] linaclotide (LINZESS) 290 MCG CAPS capsule Take 1 capsule (290 mcg total) by mouth daily before breakfast. (Patient not taking: Reported on 04/23/2021)   [DISCONTINUED] TURMERIC PO Take by mouth daily. (Patient not taking: Reported on 04/23/2021)   No facility-administered  encounter medications on file as of 05/18/2022.

## 2022-05-19 ENCOUNTER — Other Ambulatory Visit (INDEPENDENT_AMBULATORY_CARE_PROVIDER_SITE_OTHER): Payer: No Typology Code available for payment source

## 2022-05-19 DIAGNOSIS — E673 Hypervitaminosis D: Secondary | ICD-10-CM

## 2022-05-19 LAB — VITAMIN D 25 HYDROXY (VIT D DEFICIENCY, FRACTURES): VITD: 104.79 ng/mL (ref 30.00–100.00)

## 2022-05-25 ENCOUNTER — Ambulatory Visit
Admission: RE | Admit: 2022-05-25 | Discharge: 2022-05-25 | Disposition: A | Discharge status: Home / Self Care | Payer: No Typology Code available for payment source | Source: Ambulatory Visit | Attending: Internal Medicine | Admitting: Internal Medicine

## 2022-05-25 DIAGNOSIS — Z1231 Encounter for screening mammogram for malignant neoplasm of breast: Secondary | ICD-10-CM | POA: Diagnosis present

## 2022-06-18 IMAGING — MG MM DIGITAL SCREENING BILAT W/ TOMO AND CAD
6 of 10 series · 6 of 30 positions shown · non-contrast
Comparison: Previous exam(s).

CLINICAL DATA: Screening.

EXAM:
DIGITAL SCREENING BILATERAL MAMMOGRAM WITH TOMOSYNTHESIS AND CAD
TECHNIQUE: Bilateral screening digital craniocaudal and mediolateral oblique
mammograms were obtained. Bilateral screening digital breast
tomosynthesis was performed. The images were evaluated with
computer-aided detection.

[L MLO synth-2D (1 of 2)]
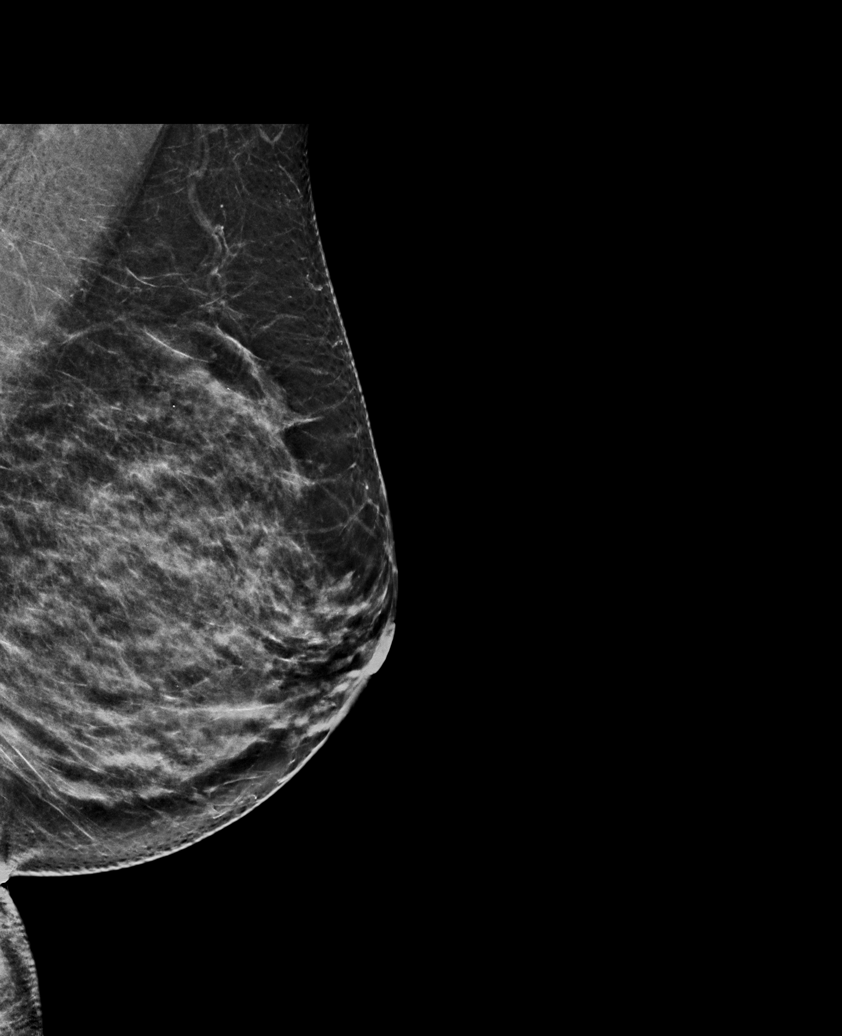

[R CC synth-2D]
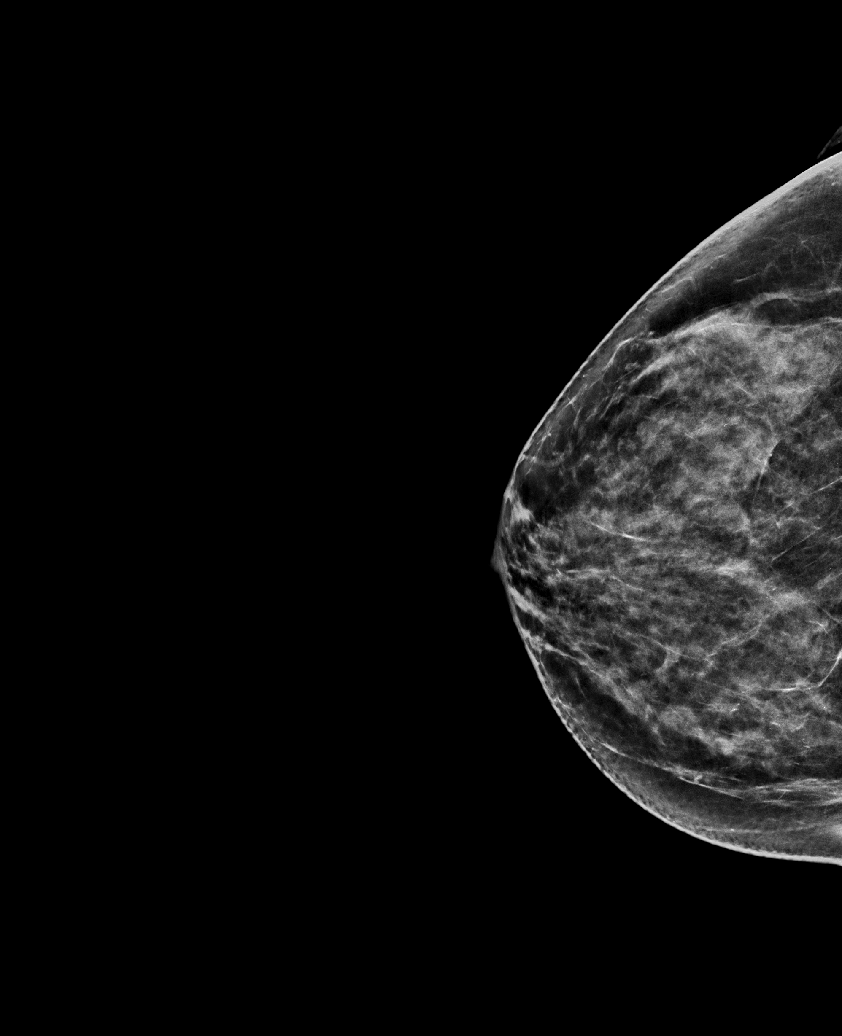

[R MLO synth-2D]
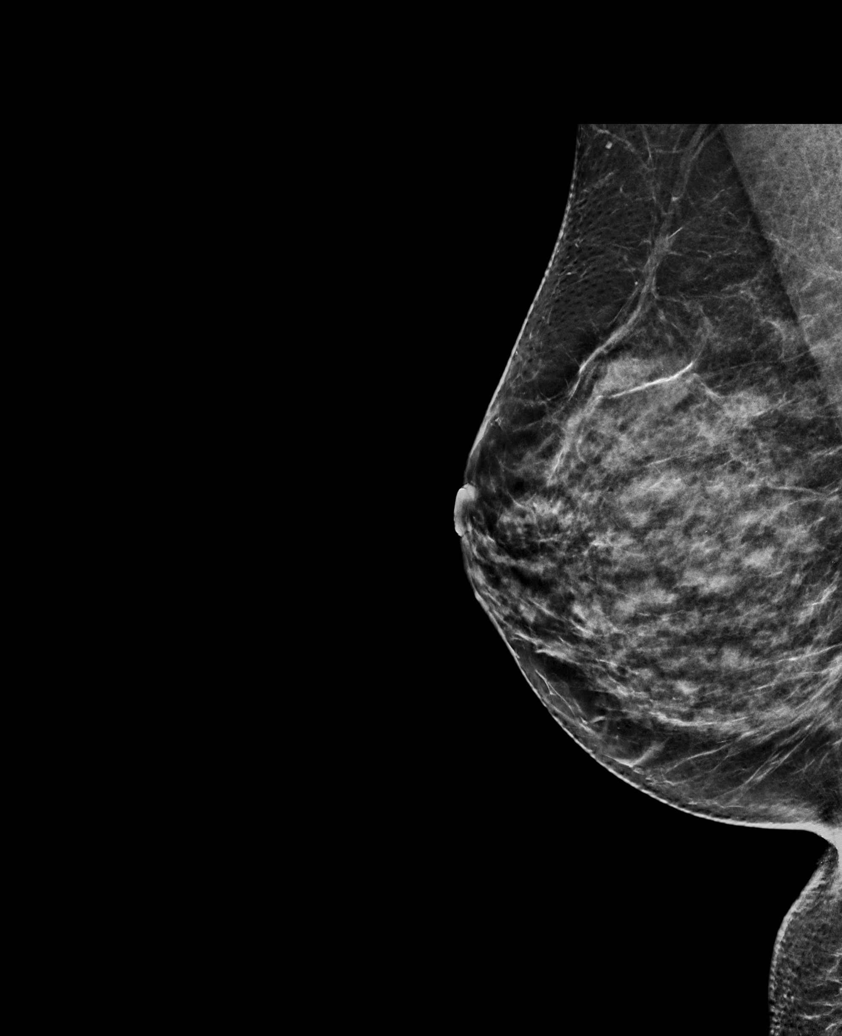

[L MLO synth-2D (2 of 2)]
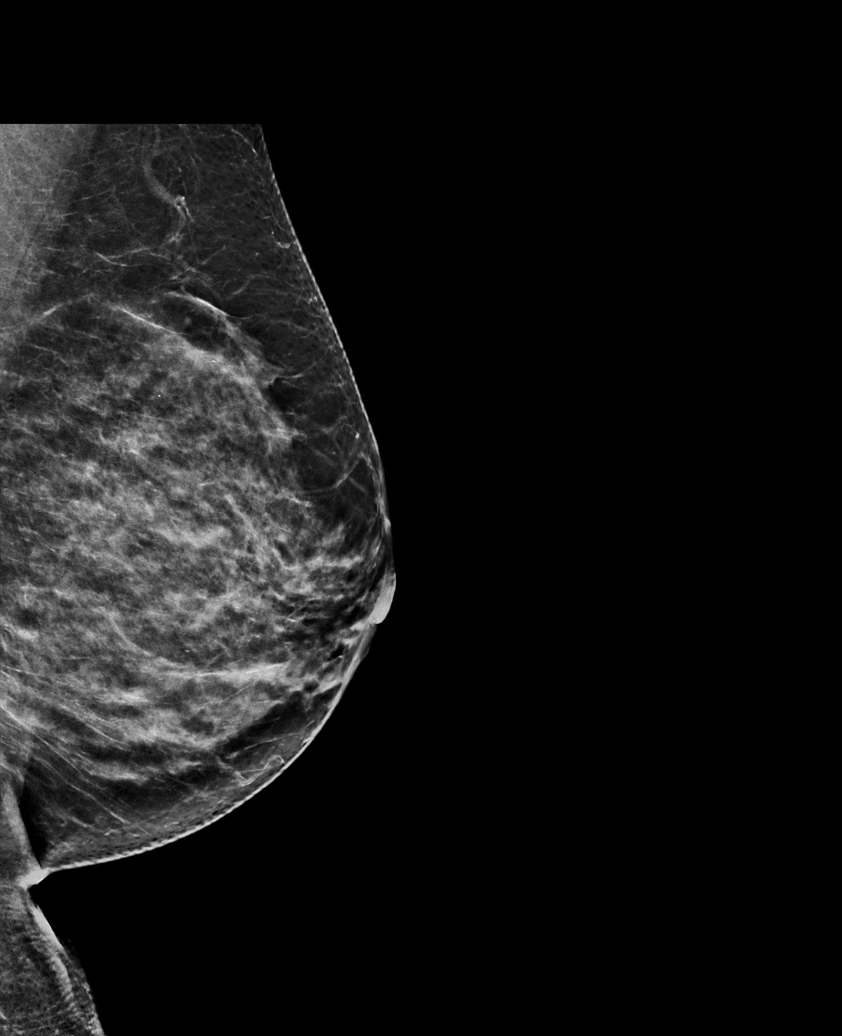

[L CC synth-2D]
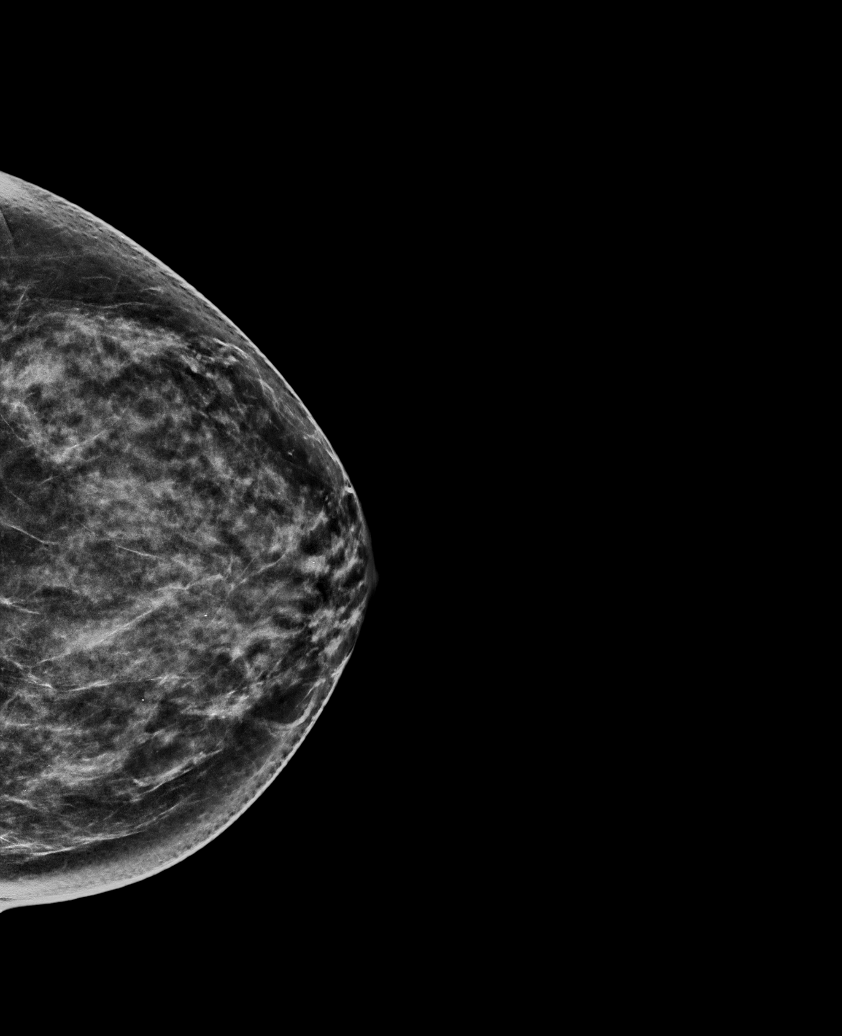

[L MLO tomo · tomo slice 35/70.0]
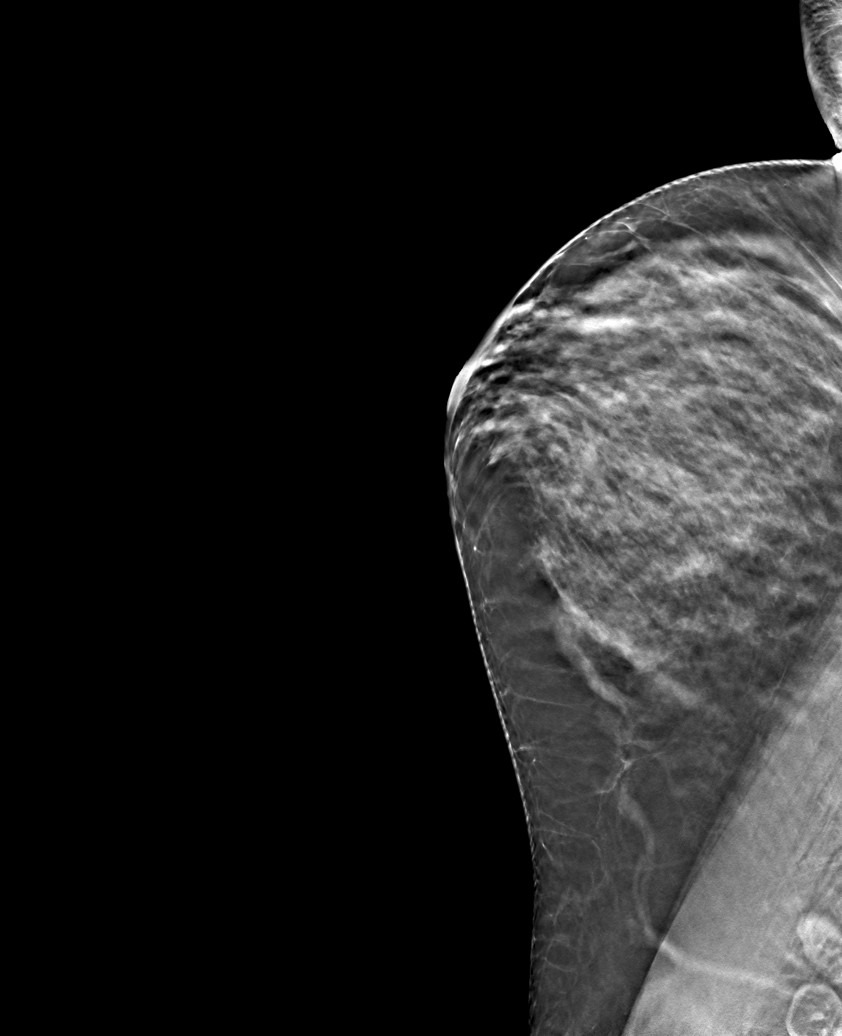

[6 of 30 positions shown; findings below may reference images not displayed]

ACR Breast Density Category c: The breast tissue is heterogeneously
dense, which may obscure small masses.
FINDINGS: There are no findings suspicious for malignancy. The images were
evaluated with computer-aided detection.
IMPRESSION: No mammographic evidence of malignancy. A result letter of this
screening mammogram will be mailed directly to the patient.

RECOMMENDATION:
Screening mammogram in one year. (Code:T4-5-GWO)

BI-RADS CATEGORY  1: Negative.

## 2022-06-22 ENCOUNTER — Encounter: Payer: Self-pay | Admitting: Internal Medicine

## 2022-06-25 ENCOUNTER — Other Ambulatory Visit: Payer: No Typology Code available for payment source

## 2022-07-09 ENCOUNTER — Encounter: Payer: Self-pay | Admitting: Internal Medicine

## 2022-07-09 ENCOUNTER — Ambulatory Visit (INDEPENDENT_AMBULATORY_CARE_PROVIDER_SITE_OTHER): Payer: No Typology Code available for payment source | Admitting: Internal Medicine

## 2022-07-09 ENCOUNTER — Ambulatory Visit (INDEPENDENT_AMBULATORY_CARE_PROVIDER_SITE_OTHER): Payer: No Typology Code available for payment source

## 2022-07-09 VITALS — BP 110/76 | HR 67 | Temp 97.8°F | Ht 63.0 in | Wt 140.6 lb

## 2022-07-09 DIAGNOSIS — R0781 Pleurodynia: Secondary | ICD-10-CM | POA: Diagnosis not present

## 2022-07-09 DIAGNOSIS — Z23 Encounter for immunization: Secondary | ICD-10-CM | POA: Diagnosis not present

## 2022-07-09 DIAGNOSIS — R1013 Epigastric pain: Secondary | ICD-10-CM

## 2022-07-09 DIAGNOSIS — M549 Dorsalgia, unspecified: Secondary | ICD-10-CM | POA: Diagnosis not present

## 2022-07-09 DIAGNOSIS — K439 Ventral hernia without obstruction or gangrene: Secondary | ICD-10-CM | POA: Diagnosis not present

## 2022-07-09 LAB — D-DIMER, QUANTITATIVE: D-Dimer, Quant: <0.19 mcg/mL FEU (ref <0.50)

## 2022-07-09 NOTE — Progress Notes (Addendum)
Chief Complaint  Patient presents with   Back Pain    Has had back pain for a month pain level is now at a 3   F/u Deep breath causing mid back pain x 1 month and also having RUQ ab pain mild to moderate. She tried advil to relieve. She does eat fatty foods s/p hysterectomy for endometriosis she feels like rolling sensation in RUQ abdomen and when taking deep breath it hurts in mid back. 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  HENT:  Negative for hearing loss.   Eyes:  Negative for blurred vision.  Respiratory:  Negative for shortness of breath.   Cardiovascular:  Negative for chest pain.  Gastrointestinal:  Positive for abdominal pain. Negative for blood in stool, nausea and vomiting.  Genitourinary:  Negative for dysuria.  Musculoskeletal:  Negative for falls and joint pain.  Skin:  Negative for rash.  Neurological:  Negative for headaches.  Psychiatric/Behavioral:  Negative for depression.    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   left side   Trochanteric bursitis of left hip    Vitamin D deficiency    Wears contact lenses    Past Surgical History:  Procedure Laterality Date   CATARACT  EXTRACTION W/PHACO Right 04/02/2020   Procedure: CATARACT EXTRACTION PHACO AND INTRAOCULAR LENS PLACEMENT ([place]) RIGHT VIVITY TORIC LENS;  Surgeon: Birder Robson, MD;  Location: [place];  Service: Ophthalmology;  Laterality: Right;  2.09 0:25.6   CATARACT EXTRACTION W/PHACO Left 04/23/2020   Procedure: CATARACT EXTRACTION PHACO AND INTRAOCULAR LENS PLACEMENT ([facility]) LEFT PANOPTIX TORIC 4.42 00:28.6;  Surgeon: Birder Robson, MD;  Location: [place];  Service: Ophthalmology;  Laterality: Left;   COLONOSCOPY  last one 09-18-2013   COLONOSCOPY WITH PROPOFOL N/A 09/25/2019   Procedure: COLONOSCOPY WITH PROPOFOL;  Surgeon: Jonathon Bellows, MD;  Location: [facility] ENDOSCOPY;  Service: Gastroenterology;  Laterality: N/A;   DILATION AND CURETTAGE OF UTERUS  1993   w/  suction for missed ab   DX LAPAROSCOPY W/ [place];  [place] N/A 06/04/2017   Procedure: HEMORRHOIDECTOMY single column and HEMORRHOIDOPEXY;  Surgeon: Leighton Ruff, MD;  Location: [place];  Service: General;  Laterality: N/A;   LAPAROSCOPIC VAGINAL HYSTERECTOMY WITH SALPINGO OOPHORECTOMY Bilateral 04-05-2008  dr Phineas Real at [facility]   w/  Fulgeration endometriosis   NEPHRECTOMY LIVING DONOR Left 2011   to family member   TONSILLECTOMY  age 17   Family History  Problem Relation Age of Onset   Mental illness Father 42  bipolar, committed suicide   Cancer Sister 2       endometrial, thyroid   Cancer Paternal Aunt        breast ca   Breast cancer Paternal Aunt    Cancer Sister        thyroid ca   Cancer Sister 50       melanoma   Hearing loss Daughter    Breast cancer Cousin        1st paternal   Social History   Socioeconomic History   Marital status: Married    Spouse name: Not on file   Number of children: Not on file   Years of education: Not on file   Highest education level: Not on file  Occupational History   Not on file  Tobacco Use    Smoking status: Never   Smokeless tobacco: Never  Vaping Use   Vaping Use: Never used  Substance and Sexual Activity   Alcohol use: Yes    Alcohol/week: 5.0 standard drinks of alcohol    Types: 5 Shots of liquor per week    Comment: (weekends)   Drug use: No   Sexual activity: Yes    Birth control/protection: Surgical  Other Topics Concern   Not on file  Social History Narrative   Not on file   Social Determinants of Health   Financial Resource Strain: Not on file  Food Insecurity: Not on file  Transportation Needs: Not on file  Physical Activity: Not on file  Stress: Not on file  Social Connections: Not on file  Intimate Partner Violence: Not on file   Current Meds  Medication Sig   ALPRAZolam (XANAX) 0.5 MG tablet TAKE 1 TABLET BY MOUTH AT BEDTIME AS NEEDED FOR ANXIETY.   docusate sodium (COLACE) 100 MG capsule Take 100 mg by mouth daily as needed for mild constipation.   ipratropium (ATROVENT) 0.03 % nasal spray Place 2 sprays into both nostrils every 12 (twelve) hours.   Magnesium 400 MG TABS Take 1 tablet by mouth 2 (two) times daily.   PARoxetine (PAXIL) 20 MG tablet Take 1 tablet (20 mg total) by mouth every morning.   Potassium 99 MG TABS Take 1 tablet by mouth 2 (two) times daily.    Vitamin D-Vitamin K (VITAMIN K2-VITAMIN D3 PO) Take 1 capsule by mouth daily.   No Known Allergies Recent Results (from the past 2160 hour(s))  CBC with Differential/Platelet     Status: None   Collection Time: 05/18/22 11:19 AM  Result Value Ref Range   WBC 5.0 4.0 - 10.5 K/uL   RBC 4.34 3.87 - 5.11 Mil/uL   Hemoglobin 13.7 12.0 - 15.0 g/dL   HCT 40.3 36.0 - 46.0 %   MCV 93.0 78.0 - 100.0 fl   MCHC 34.0 30.0 - 36.0 g/dL   RDW 14.4 11.5 - 15.5 %   Platelets 184.0 150.0 - 400.0 K/uL   Neutrophils Relative % 55.3 43.0 - 77.0 %   Lymphocytes Relative 34.1 12.0 - 46.0 %   Monocytes Relative 8.2 3.0 - 12.0 %   Eosinophils Relative 1.9 0.0 - 5.0 %   Basophils Relative 0.5 0.0 -  3.0 %   Neutro Abs 2.8 1.4 - 7.7 K/uL   Lymphs Abs 1.7 0.7 - 4.0 K/uL   Monocytes Absolute 0.4 0.1 - 1.0 K/uL   Eosinophils Absolute 0.1 0.0 - 0.7 K/uL   Basophils Absolute 0.0 0.0 - 0.1 K/uL  Comprehensive metabolic panel     Status:  None   Collection Time: 05/18/22 11:19 AM  Result Value Ref Range   Sodium 135 135 - 145 mEq/L   Potassium 4.5 3.5 - 5.1 mEq/L   Chloride 96 96 - 112 mEq/L   CO2 30 19 - 32 mEq/L   Glucose, Bld 86 70 - 99 mg/dL   BUN 16 6 - 23 mg/dL   Creatinine, Ser 0.76 0.40 - 1.20 mg/dL   Total Bilirubin 0.6 0.2 - 1.2 mg/dL   Alkaline Phosphatase 65 39 - 117 U/L   AST 17 0 - 37 U/L   ALT 16 0 - 35 U/L   Total Protein 6.8 6.0 - 8.3 g/dL   Albumin 4.3 3.5 - 5.2 g/dL   GFR 84.17 >60.00 mL/min    Comment: Calculated using the CKD-EPI Creatinine Equation (2021)   Calcium 9.3 8.4 - 10.5 mg/dL  Lipid panel     Status: Abnormal   Collection Time: 05/18/22 11:19 AM  Result Value Ref Range   Cholesterol 267 (H) 0 - 200 mg/dL    Comment: ATP III Classification       Desirable:  < 200 mg/dL               Borderline High:  200 - 239 mg/dL          High:  > = 240 mg/dL   Triglycerides 97.0 0.0 - 149.0 mg/dL    Comment: Normal:  <150 mg/dLBorderline High:  150 - 199 mg/dL   HDL 97.50 >39.00 mg/dL   VLDL 19.4 0.0 - 40.0 mg/dL   LDL Cholesterol 150 (H) 0 - 99 mg/dL   Total CHOL/HDL Ratio 3     Comment:                Men          Women1/2 Average Risk     3.4          3.3Average Risk          5.0          4.42X Average Risk          9.6          7.13X Average Risk          15.0          11.0                       NonHDL 169.69     Comment: NOTE:  Non-HDL goal should be 30 mg/dL higher than patient's LDL goal (i.e. LDL goal of < 70 mg/dL, would have non-HDL goal of < 100 mg/dL)  TSH     Status: None   Collection Time: 05/18/22 11:19 AM  Result Value Ref Range   TSH 1.14 0.35 - 5.50 uIU/mL  VITAMIN D 25 Hydroxy (Vit-D Deficiency, Fractures)     Status: Abnormal   Collection  Time: 05/19/22  9:15 AM  Result Value Ref Range   VITD 104.79 (HH) 30.00 - 100.00 ng/mL   Objective  Body mass index is 24.91 kg/m. Wt Readings from Last 3 Encounters:  07/09/22 140 lb 9.6 oz (63.8 kg)  05/18/22 138 lb 6.4 oz (62.8 kg)  04/23/21 147 lb 6.4 oz (66.9 kg)   Temp Readings from Last 3 Encounters:  07/09/22 97.8 F (36.6 C) (Oral)  05/18/22 97.6 F (36.4 C) (Oral)  04/23/21 97.6 F (36.4 C) (Oral)   BP Readings from Last 3 Encounters:  07/09/22 110/76  05/18/22  140/72  04/23/21 (!) 152/82   Pulse Readings from Last 3 Encounters:  07/09/22 67  05/18/22 68  04/23/21 (!) 58    Physical Exam Vitals and nursing note reviewed.  Constitutional:      Appearance: Normal appearance. She is well-developed and well-groomed.  HENT:     Head: Normocephalic and atraumatic.  Eyes:     Conjunctiva/sclera: Conjunctivae normal.     Pupils: Pupils are equal, round, and reactive to light.  Cardiovascular:     Rate and Rhythm: Normal rate and regular rhythm.     Heart sounds: Normal heart sounds. No murmur heard. Pulmonary:     Effort: Pulmonary effort is normal.     Breath sounds: Normal breath sounds.  Abdominal:     General: Abdomen is flat. Bowel sounds are normal.     Tenderness: There is abdominal tenderness in the right upper quadrant. There is no right CVA tenderness, left CVA tenderness, guarding or rebound.     Hernia: A hernia is present.  Musculoskeletal:        General: No tenderness.       Arms:  Skin:    General: Skin is warm and dry.  Neurological:     General: No focal deficit present.     Mental Status: She is alert and oriented to person, place, and time. Mental status is at baseline.     Cranial Nerves: Cranial nerves 2-12 are intact.     Motor: Motor function is intact.     Coordination: Coordination is intact.     Gait: Gait is intact.  Psychiatric:        Attention and Perception: Attention and perception normal.        Mood and Affect:  Mood and affect normal.        Speech: Speech normal.        Behavior: Behavior normal. Behavior is cooperative.        Thought Content: Thought content normal.        Cognition and Memory: Cognition and memory normal.        Judgment: Judgment normal.     Assessment  Plan  Mid back pain  ? Referred from abdomen GI etiology gallstones, hernia (with h/o abdominal surgery and hernias) r/o PE- Plan: DG Thoracic Spine 2 View, CT Abdomen Pelvis W Contrast,   Epigastric pain - Plan: Comprehensive metabolic panel, Urinalysis, Routine w reflex microscopic, CBC with Differential/Platelet, CT Abdomen Pelvis W Contrast, see above Prn Tylenol   Pleuritic pain r/o PE- Plan: D-Dimer, Quantitative  Ventral hernia without obstruction or gangrene - Plan: CT Abdomen Pelvis W Contrast   Flu shot given today  Provider: Dr. Olivia Mackie McLean-Scocuzza-Internal Medicine

## 2022-07-10 ENCOUNTER — Encounter: Payer: Self-pay | Admitting: Internal Medicine

## 2022-07-10 ENCOUNTER — Ambulatory Visit
Admission: RE | Admit: 2022-07-10 | Discharge: 2022-07-10 | Disposition: A | Discharge status: Home / Self Care | Payer: No Typology Code available for payment source | Source: Ambulatory Visit | Attending: Internal Medicine | Admitting: Internal Medicine

## 2022-07-10 DIAGNOSIS — K439 Ventral hernia without obstruction or gangrene: Secondary | ICD-10-CM | POA: Insufficient documentation

## 2022-07-10 DIAGNOSIS — R1013 Epigastric pain: Secondary | ICD-10-CM | POA: Diagnosis present

## 2022-07-10 DIAGNOSIS — M549 Dorsalgia, unspecified: Secondary | ICD-10-CM | POA: Diagnosis present

## 2022-07-10 LAB — CBC WITH DIFFERENTIAL/PLATELET
Basophils Absolute: 0 10*3/uL (ref 0.0–0.1)
Basophils Relative: 0.7 % (ref 0.0–3.0)
Eosinophils Absolute: 0.1 10*3/uL (ref 0.0–0.7)
Eosinophils Relative: 1.5 % (ref 0.0–5.0)
HCT: 37.9 % (ref 36.0–46.0)
Hemoglobin: 13.1 g/dL (ref 12.0–15.0)
Lymphocytes Relative: 36 % (ref 12.0–46.0)
Lymphs Abs: 2 10*3/uL (ref 0.7–4.0)
MCHC: 34.6 g/dL (ref 30.0–36.0)
MCV: 91.4 fl (ref 78.0–100.0)
Monocytes Absolute: 0.4 10*3/uL (ref 0.1–1.0)
Monocytes Relative: 7.1 % (ref 3.0–12.0)
Neutro Abs: 3 10*3/uL (ref 1.4–7.7)
Neutrophils Relative %: 54.7 % (ref 43.0–77.0)
Platelets: 199 10*3/uL (ref 150.0–400.0)
RBC: 4.15 Mil/uL (ref 3.87–5.11)
RDW: 15.1 % (ref 11.5–15.5)
WBC: 5.4 10*3/uL (ref 4.0–10.5)

## 2022-07-10 LAB — COMPREHENSIVE METABOLIC PANEL
ALT: 20 U/L (ref 0–35)
AST: 18 U/L (ref 0–37)
Albumin: 4.2 g/dL (ref 3.5–5.2)
Alkaline Phosphatase: 82 U/L (ref 39–117)
BUN: 27 mg/dL — ABNORMAL HIGH (ref 6–23)
CO2: 28 mEq/L (ref 19–32)
Calcium: 9.9 mg/dL (ref 8.4–10.5)
Chloride: 102 mEq/L (ref 96–112)
Creatinine, Ser: 0.91 mg/dL (ref 0.40–1.20)
GFR: 67.74 mL/min (ref >60.00)
Glucose, Bld: 97 mg/dL (ref 70–99)
Potassium: 4.1 mEq/L (ref 3.5–5.1)
Sodium: 139 mEq/L (ref 135–145)
Total Bilirubin: 0.4 mg/dL (ref 0.2–1.2)
Total Protein: 6.8 g/dL (ref 6.0–8.3)

## 2022-07-10 LAB — URINALYSIS, ROUTINE W REFLEX MICROSCOPIC
Bacteria, UA: NONE SEEN /HPF
Bilirubin Urine: NEGATIVE
Glucose, UA: NEGATIVE
Hgb urine dipstick: NEGATIVE
Hyaline Cast: NONE SEEN /LPF
Ketones, ur: NEGATIVE
Nitrite: NEGATIVE
Protein, ur: NEGATIVE
RBC / HPF: NONE SEEN /HPF (ref 0–2)
Specific Gravity, Urine: 1.006 (ref 1.001–1.035)
WBC, UA: NONE SEEN /HPF (ref 0–5)
pH: 7.5 (ref 5.0–8.0)

## 2022-07-10 LAB — MICROSCOPIC MESSAGE

## 2022-07-10 LAB — POCT I-STAT CREATININE: Creatinine, Ser: 0.8 mg/dL (ref 0.44–1.00)

## 2022-07-10 MED ORDER — IOHEXOL 300 MG/ML  SOLN
85.0000 mL | Freq: Once | INTRAMUSCULAR | Status: AC | PRN
  Administered 2022-07-10: 85 mL via INTRAVENOUS

## 2022-07-11 LAB — URINALYSIS, ROUTINE W REFLEX MICROSCOPIC
Bilirubin Urine: NEGATIVE
Glucose, UA: NEGATIVE
Hgb urine dipstick: NEGATIVE
Ketones, ur: NEGATIVE
Leukocytes,Ua: NEGATIVE
Nitrite: NEGATIVE
Protein, ur: NEGATIVE
Specific Gravity, Urine: 1.012 (ref 1.001–1.035)
pH: 6.5 (ref 5.0–8.0)

## 2022-07-13 ENCOUNTER — Telehealth: Payer: Self-pay

## 2022-07-13 NOTE — Telephone Encounter (Signed)
Called pt but she was unavailable someone took a msg down for her to give a call back in regards to Dr. Myer Peer question even though she saw her labs on MyChart:   McLean-Scocuzza, Nino Glow, MD  Gracy Racer, CMA Mild height loss, curvature of mid lower spine, osteophytosis=arthritis changes  Consider bone density with Dr. Derrel Nip in the future  If back continues to hurt in mid back consider MRI mid and lower back in the future  Does she want to try PT?

## 2022-08-10 ENCOUNTER — Ambulatory Visit (INDEPENDENT_AMBULATORY_CARE_PROVIDER_SITE_OTHER): Payer: No Typology Code available for payment source | Admitting: Internal Medicine

## 2022-08-10 DIAGNOSIS — G8929 Other chronic pain: Secondary | ICD-10-CM

## 2022-08-10 DIAGNOSIS — M545 Low back pain, unspecified: Secondary | ICD-10-CM | POA: Insufficient documentation

## 2022-08-10 DIAGNOSIS — M5440 Lumbago with sciatica, unspecified side: Secondary | ICD-10-CM

## 2022-08-10 NOTE — Progress Notes (Signed)
Patient failed to keep scheduled appointment and will be charged a no show fee.   

## 2022-08-17 ENCOUNTER — Other Ambulatory Visit: Payer: Self-pay

## 2022-09-01 ENCOUNTER — Other Ambulatory Visit: Payer: Self-pay

## 2022-09-09 ENCOUNTER — Encounter: Payer: Self-pay | Admitting: Internal Medicine

## 2022-09-09 ENCOUNTER — Other Ambulatory Visit: Payer: Self-pay

## 2022-09-10 ENCOUNTER — Other Ambulatory Visit: Payer: Self-pay

## 2022-11-23 ENCOUNTER — Other Ambulatory Visit: Payer: Self-pay | Admitting: Internal Medicine

## 2022-11-24 ENCOUNTER — Other Ambulatory Visit: Payer: Self-pay

## 2022-11-24 MED ORDER — ALPRAZOLAM 0.5 MG PO TABS
0.5000 mg | ORAL_TABLET | Freq: Every evening | ORAL | 5 refills | Status: DC | PRN
  Filled 2022-11-24: qty 15, 30d supply, fill #0
  Filled 2023-02-28: qty 15, 30d supply, fill #1

## 2023-02-28 ENCOUNTER — Other Ambulatory Visit: Payer: Self-pay

## 2023-03-01 ENCOUNTER — Other Ambulatory Visit: Payer: Self-pay

## 2023-03-23 DIAGNOSIS — Z961 Presence of intraocular lens: Secondary | ICD-10-CM | POA: Diagnosis not present

## 2023-03-23 DIAGNOSIS — H04123 Dry eye syndrome of bilateral lacrimal glands: Secondary | ICD-10-CM | POA: Diagnosis not present

## 2023-04-05 ENCOUNTER — Other Ambulatory Visit: Payer: Self-pay

## 2023-04-05 ENCOUNTER — Other Ambulatory Visit (HOSPITAL_COMMUNITY): Payer: Self-pay

## 2023-05-20 ENCOUNTER — Ambulatory Visit (INDEPENDENT_AMBULATORY_CARE_PROVIDER_SITE_OTHER): Payer: 59 | Admitting: Internal Medicine

## 2023-05-20 ENCOUNTER — Other Ambulatory Visit: Payer: Self-pay

## 2023-05-20 ENCOUNTER — Encounter: Payer: Self-pay | Admitting: Internal Medicine

## 2023-05-20 ENCOUNTER — Ambulatory Visit (INDEPENDENT_AMBULATORY_CARE_PROVIDER_SITE_OTHER): Payer: 59

## 2023-05-20 VITALS — BP 120/74 | HR 66 | Temp 97.7°F | Resp 17 | Ht 62.75 in | Wt 140.2 lb

## 2023-05-20 DIAGNOSIS — Z23 Encounter for immunization: Secondary | ICD-10-CM

## 2023-05-20 DIAGNOSIS — M25511 Pain in right shoulder: Secondary | ICD-10-CM

## 2023-05-20 DIAGNOSIS — E663 Overweight: Secondary | ICD-10-CM | POA: Diagnosis not present

## 2023-05-20 DIAGNOSIS — E673 Hypervitaminosis D: Secondary | ICD-10-CM

## 2023-05-20 DIAGNOSIS — Z0001 Encounter for general adult medical examination with abnormal findings: Secondary | ICD-10-CM

## 2023-05-20 DIAGNOSIS — E785 Hyperlipidemia, unspecified: Secondary | ICD-10-CM | POA: Diagnosis not present

## 2023-05-20 DIAGNOSIS — Z0000 Encounter for general adult medical examination without abnormal findings: Secondary | ICD-10-CM

## 2023-05-20 DIAGNOSIS — K760 Fatty (change of) liver, not elsewhere classified: Secondary | ICD-10-CM

## 2023-05-20 DIAGNOSIS — R6882 Decreased libido: Secondary | ICD-10-CM | POA: Diagnosis not present

## 2023-05-20 DIAGNOSIS — G8929 Other chronic pain: Secondary | ICD-10-CM

## 2023-05-20 DIAGNOSIS — Z1231 Encounter for screening mammogram for malignant neoplasm of breast: Secondary | ICD-10-CM

## 2023-05-20 LAB — TSH: TSH: 1.57 u[IU]/mL (ref 0.35–5.50)

## 2023-05-20 LAB — CBC WITH DIFFERENTIAL/PLATELET
Basophils Absolute: 0 10*3/uL (ref 0.0–0.1)
Basophils Relative: 0.6 % (ref 0.0–3.0)
Eosinophils Absolute: 0.1 10*3/uL (ref 0.0–0.7)
Eosinophils Relative: 1.2 % (ref 0.0–5.0)
HCT: 40.8 % (ref 36.0–46.0)
Hemoglobin: 13.7 g/dL (ref 12.0–15.0)
Lymphocytes Relative: 32.3 % (ref 12.0–46.0)
Lymphs Abs: 1.7 10*3/uL (ref 0.7–4.0)
MCHC: 33.7 g/dL (ref 30.0–36.0)
MCV: 93.5 fl (ref 78.0–100.0)
Monocytes Absolute: 0.4 10*3/uL (ref 0.1–1.0)
Monocytes Relative: 7.9 % (ref 3.0–12.0)
Neutro Abs: 3 10*3/uL (ref 1.4–7.7)
Neutrophils Relative %: 58 % (ref 43.0–77.0)
Platelets: 199 10*3/uL (ref 150.0–400.0)
RBC: 4.36 Mil/uL (ref 3.87–5.11)
RDW: 14.8 % (ref 11.5–15.5)
WBC: 5.2 10*3/uL (ref 4.0–10.5)

## 2023-05-20 LAB — COMPREHENSIVE METABOLIC PANEL
ALT: 16 U/L (ref 0–35)
AST: 15 U/L (ref 0–37)
Albumin: 4.4 g/dL (ref 3.5–5.2)
Alkaline Phosphatase: 72 U/L (ref 39–117)
BUN: 20 mg/dL (ref 6–23)
CO2: 30 mEq/L (ref 19–32)
Calcium: 9.6 mg/dL (ref 8.4–10.5)
Chloride: 98 mEq/L (ref 96–112)
Creatinine, Ser: 0.84 mg/dL (ref 0.40–1.20)
GFR: 74.12 mL/min (ref >60.00)
Glucose, Bld: 92 mg/dL (ref 70–99)
Potassium: 4.3 mEq/L (ref 3.5–5.1)
Sodium: 135 mEq/L (ref 135–145)
Total Bilirubin: 0.7 mg/dL (ref 0.2–1.2)
Total Protein: 7.3 g/dL (ref 6.0–8.3)

## 2023-05-20 LAB — LDL CHOLESTEROL, DIRECT: Direct LDL: 169 mg/dL

## 2023-05-20 LAB — LIPID PANEL
Cholesterol: 297 mg/dL — ABNORMAL HIGH (ref 0–200)
HDL: 101 mg/dL (ref >39.00)
LDL Cholesterol: 181 mg/dL — ABNORMAL HIGH (ref 0–99)
NonHDL: 195.58
Total CHOL/HDL Ratio: 3
Triglycerides: 71 mg/dL (ref 0.0–149.0)
VLDL: 14.2 mg/dL (ref 0.0–40.0)

## 2023-05-20 MED ORDER — CELECOXIB 100 MG PO CAPS
100.0000 mg | ORAL_CAPSULE | Freq: Two times a day (BID) | ORAL | 0 refills | Status: DC
  Filled 2023-05-20: qty 60, 30d supply, fill #0

## 2023-05-20 MED ORDER — PAROXETINE HCL 20 MG PO TABS
20.0000 mg | ORAL_TABLET | ORAL | 1 refills | Status: DC
  Filled 2023-05-20 – 2023-09-28 (×2): qty 90, 90d supply, fill #0

## 2023-05-20 NOTE — Progress Notes (Deleted)
Subjective:  Patient ID: [name], female    DOB: September 28, 1961  Age: 62 y.o. MRN: [id number]  CC: {There were no encounter diagnoses. (Refresh or delete this SmartLink)}   HPI [name] presents for No chief complaint on file.     Outpatient Medications Prior to Visit  Medication Sig Dispense Refill   ALPRAZolam (XANAX) 0.5 MG tablet Take 1 tablet (0.5 mg total) by mouth at bedtime as needed for anxiety 15 tablet 5   diclofenac (VOLTAREN) 75 MG EC tablet Take 1 tablet (75 mg total) by mouth 2 (two) times daily. As needed for hip pain (Patient not taking: Reported on 04/23/2021) 60 tablet 0   docusate sodium (COLACE) 100 MG capsule Take 100 mg by mouth daily as needed for mild constipation.     ipratropium (ATROVENT) 0.03 % nasal spray Place 2 sprays into both nostrils every 12 (twelve) hours. 30 mL 12   Magnesium 400 MG TABS Take 1 tablet by mouth 2 (two) times daily.     PARoxetine (PAXIL) 20 MG tablet Take 1 tablet (20 mg total) by mouth every morning. 90 tablet 1   Potassium 99 MG TABS Take 1 tablet by mouth 2 (two) times daily.      Vitamin D-Vitamin K (VITAMIN K2-VITAMIN D3 PO) Take 1 capsule by mouth daily.     No facility-administered medications prior to visit.    Review of Systems;  Patient denies headache, fevers, malaise, unintentional weight loss, skin rash, eye pain, sinus congestion and sinus pain, sore throat, dysphagia,  hemoptysis , cough, dyspnea, wheezing, chest pain, palpitations, orthopnea, edema, abdominal pain, nausea, melena, diarrhea, constipation, flank pain, dysuria, hematuria, urinary  Frequency, nocturia, numbness, tingling, seizures,  Focal weakness, Loss of consciousness,  Tremor, insomnia, depression, anxiety, and suicidal ideation.      Objective:  There were no vitals taken for this visit.  BP Readings from Last 3 Encounters:  07/09/22 110/76  05/18/22 140/72  04/23/21 (!) 152/82    Wt Readings from Last 3 Encounters:  07/09/22 140  lb 9.6 oz (63.8 kg)  05/18/22 138 lb 6.4 oz (62.8 kg)  04/23/21 147 lb 6.4 oz (66.9 kg)    Physical Exam  Lab Results  Component Value Date   HGBA1C 5.3 04/23/2021   HGBA1C 5.1 09/06/2017    Lab Results  Component Value Date   CREATININE 0.80 07/10/2022   CREATININE 0.91 07/09/2022   CREATININE 0.76 05/18/2022    Lab Results  Component Value Date   WBC 5.4 07/09/2022   HGB 13.1 07/09/2022   HCT 37.9 07/09/2022   PLT 199.0 07/09/2022   GLUCOSE 97 07/09/2022   CHOL 267 (H) 05/18/2022   TRIG 97.0 05/18/2022   HDL 97.50 05/18/2022   LDLCALC 150 (H) 05/18/2022   ALT 20 07/09/2022   AST 18 07/09/2022   NA 139 07/09/2022   K 4.1 07/09/2022   CL 102 07/09/2022   CREATININE 0.80 07/10/2022   BUN 27 (H) 07/09/2022   CO2 28 07/09/2022   TSH 1.14 05/18/2022   HGBA1C 5.3 04/23/2021   MICROALBUR <0.7 04/23/2021    CT Abdomen Pelvis W Contrast  Result Date: 07/10/2022 CLINICAL DATA:  Abdominal pain, acute. Epigastric pain radiating to the abdomen EXAM: CT ABDOMEN AND PELVIS WITH CONTRAST TECHNIQUE: Multidetector CT imaging of the abdomen and pelvis was performed using the standard protocol following bolus administration of intravenous contrast. RADIATION DOSE REDUCTION: This exam was performed according to the departmental dose-optimization program which includes automated  exposure control, adjustment of the mA and/or kV according to patient size and/or use of iterative reconstruction technique. CONTRAST:  85mL OMNIPAQUE IOHEXOL 300 MG/ML  SOLN COMPARISON:  12/08/2018 FINDINGS: Lower chest: Small cysts or emphysematous spaces at the bases, stable. Nodularity in the posterior mediastinum, the largest measuring 18 mm right of the aorta. These are nonspecific but stable from prior and considered benign. Hepatobiliary: No focal liver abnormality.No evidence of biliary obstruction or stone. Pancreas: Unremarkable. Spleen: Unremarkable. Adrenals/Urinary Tract: Negative adrenals. Left  nephrectomy. No nodularity in the resection bed. Unremarkable right kidney. Unremarkable bladder. Stomach/Bowel:  No obstruction. No appendicitis. Vascular/Lymphatic: No acute vascular abnormality. Mild atheromatous calcification. No mass or adenopathy. Reproductive:Hysterectomy Other: No ascites or pneumoperitoneum. Musculoskeletal: Dextroscoliosis and lumbar spine degeneration. IMPRESSION: 1. No acute finding or specific cause for symptoms. 2. Nonspecific enlargement of nodes in the posterior mediastinum that are stable from 2020 and thus benign. Electronically Signed   By: Tiburcio Pea M.D.   On: 07/10/2022 10:02    Assessment & Plan:  .There are no diagnoses linked to this encounter.   I provided 30 minutes of face-to-face time during this encounter reviewing patient's last visit with me, patient's  most recent visit with cardiology,  nephrology,  and neurology,  recent surgical and non surgical procedures, previous  labs and imaging studies, counseling on currently addressed issues,  and post visit ordering to diagnostics and therapeutics .   Follow-up: No follow-ups on file.   Sherlene Shams, MD

## 2023-05-20 NOTE — Progress Notes (Unsigned)
Patient ID: [name], female    DOB: 1961-05-13  Age: 62 y.o. MRN: [id number]  The patient is here for annual preventive examination and management of other chronic and acute problems.   The risk factors are reflected in the social history.   The roster of all physicians providing medical care to patient - is listed in the Snapshot section of the chart.   Activities of daily living:  The patient is 100% independent in all ADLs: dressing, toileting, feeding as well as independent mobility   Home safety : The patient has smoke detectors in the home. They wear seatbelts.  There are no unsecured firearms at home. There is no violence in the home.    There is no risks for hepatitis, STDs or HIV. There is no   history of blood transfusion. They have no travel history to infectious disease endemic areas of the world.   The patient has seen their dentist in the last six month. They have seen their eye doctor in the last year. The patinet  denies slight hearing difficulty with regard to whispered voices and some television programs.  They have deferred audiologic testing in the last year.  They do not  have excessive sun exposure. Discussed the need for sun protection: hats, long sleeves and use of sunscreen if there is significant sun exposure.    Diet: the importance of a healthy diet is discussed. They do have a healthy diet.   The benefits of regular aerobic exercise were discussed. The patient  exercises  3 to 5 days per week  for  60 minutes.    Depression screen: there are no signs or vegative symptoms of depression- irritability, change in appetite, anhedonia, sadness/tearfullness.   The following portions of the patient's history were reviewed and updated as appropriate: allergies, current medications, past family history, past medical history,  past surgical history, past social history  and problem list.   Visual acuity was not assessed per patient preference since the patient has  regular follow up with an  ophthalmologist. Hearing and body mass index were assessed and reviewed.    During the course of the visit the patient was educated and counseled about appropriate screening and preventive services including : fall prevention , diabetes screening, nutrition counseling, colorectal cancer screening, and recommended immunizations.    Chief Complaint:  HPI     Annual Exam    Additional comments: CPE      Last edited by Warden Fillers, CMA on 05/20/2023 10:15 AM.     1) decreased libido.  Vaginal dryness using otc lubricants, denies pain . Curious oaf Addui  (flibanserin)  2) Right shoulder pain x 1 month.  Pain ws intermittent  at times severe adggravated by adductio and flesxion    Review of Symptoms  Patient denies headache, fevers, malaise, unintentional weight loss, skin rash, eye pain, sinus congestion and sinus pain, sore throat, dysphagia,  hemoptysis , cough, dyspnea, wheezing, chest pain, palpitations, orthopnea, edema, abdominal pain, nausea, melena, diarrhea, constipation, flank pain, dysuria, hematuria, urinary  Frequency, nocturia, numbness, tingling, seizures,  Focal weakness, Loss of consciousness,  Tremor, insomnia, depression, anxiety, and suicidal ideation.    Physical Exam:  BP 120/74   Pulse 66   Temp 97.7 F (36.5 C) (Oral)   Resp 17   Ht 5' 2.75" (1.594 m)   Wt 140 lb 4 oz (63.6 kg)   SpO2 99%   BMI 25.04 kg/m    Physical Exam Vitals reviewed.  Constitutional:      General: She is not in acute distress.    Appearance: Normal appearance. She is well-developed and normal weight. She is not ill-appearing, toxic-appearing or diaphoretic.  HENT:     Head: Normocephalic.     Right Ear: Tympanic membrane, ear canal and external ear normal. There is no impacted cerumen.     Left Ear: Tympanic membrane, ear canal and external ear normal. There is no impacted cerumen.     Nose: Nose normal.     Mouth/Throat:     Mouth: Mucous  membranes are moist.     Pharynx: Oropharynx is clear.  Eyes:     General: No scleral icterus.       Right eye: No discharge.        Left eye: No discharge.     Conjunctiva/sclera: Conjunctivae normal.     Pupils: Pupils are equal, round, and reactive to light.  Neck:     Thyroid: No thyromegaly.     Vascular: No carotid bruit or JVD.  Cardiovascular:     Rate and Rhythm: Normal rate and regular rhythm.     Heart sounds: Normal heart sounds.  Pulmonary:     Effort: Pulmonary effort is normal. No respiratory distress.     Breath sounds: Normal breath sounds.  Chest:  Breasts:    Breasts are symmetrical.     Right: Normal. No swelling, inverted nipple, mass, nipple discharge, skin change or tenderness.     Left: Normal. No swelling, inverted nipple, mass, nipple discharge, skin change or tenderness.  Abdominal:     General: Bowel sounds are normal.     Palpations: Abdomen is soft. There is no mass.     Tenderness: There is no abdominal tenderness. There is no guarding or rebound.  Musculoskeletal:        General: Normal range of motion.     Cervical back: Normal range of motion and neck supple.  Lymphadenopathy:     Cervical: No cervical adenopathy.     Upper Body:     Right upper body: No supraclavicular, axillary or pectoral adenopathy.     Left upper body: No supraclavicular, axillary or pectoral adenopathy.  Skin:    General: Skin is warm and dry.  Neurological:     General: No focal deficit present.     Mental Status: She is alert and oriented to person, place, and time. Mental status is at baseline.  Psychiatric:        Mood and Affect: Mood normal.        Behavior: Behavior normal.        Thought Content: Thought content normal.        Judgment: Judgment normal.    Assessment and Plan: Encounter for screening mammogram for malignant neoplasm of breast  Hyperlipidemia with target LDL less than 160 Assessment & Plan: LDL is mildly elevated but  10 yr risk  of CAD  using E AHA th calculator is < 4% . No treatment recommended except continued participation in aerobic exercise, heart healthy/low GI diet    Lab Results  Component Value Date   CHOL 297 (H) 05/20/2023   HDL 101.00 05/20/2023   LDLCALC 181 (H) 05/20/2023   LDLDIRECT 169.0 05/20/2023   TRIG 71.0 05/20/2023   CHOLHDL 3 05/20/2023     Orders: -     Lipid panel -     LDL cholesterol, direct -     Comprehensive metabolic panel  Overweight (BMI 25.0-29.9) -  Lipid panel -     LDL cholesterol, direct -     Comprehensive metabolic panel  Encounter for preventive health examination Assessment & Plan: age appropriate education and counseling updated, referrals for preventative services and immunizations addressed, dietary and smoking counseling addressed, most recent labs reviewed.  I have personally reviewed and have noted:   1) the patient's medical and social history 2) The pt's use of alcohol, tobacco, and illicit drugs 3) The patient's current medications and supplements 4) Functional ability including ADL's, fall risk, home safety risk, hearing and visual impairment 5) Diet and physical activities 6) Evidence for depression or mood disorder 7) The patient's height, weight, and BMI have been recorded in the chart    I have made referrals, and provided counseling and education based on review of the above    Chronic right shoulder pain -     DG Shoulder Right; Future  Decreased libido -     TSH -     CBC with Differential/Platelet  Encounter for immunization -     Tdap vaccine greater than or equal to 7yo IM  Fatty liver Assessment & Plan: Managed with ultra low carb diet and weight loss with normalization of BMI.   Liver enzymes are normal   Lab Results  Component Value Date   ALT 16 05/20/2023   AST 15 05/20/2023   ALKPHOS 72 05/20/2023   BILITOT 0.7 05/20/2023      Hypervitaminosis D Assessment & Plan: Occurred while taking a Vitamin D supplement  She  did not return for repeat level last year .    Nontraumatic shoulder pain, right Assessment & Plan: Exam suggestive of rotator cuff syndrome.  Plain film orered to rule out contribution fro m bone spurring .  Trial of celebrex in lieu of ibuprofen.    Decreased libido without sexual dysfunction Assessment & Plan: She denies dyspareunia;  vaginal dryness is managed with OTC lubricants.  She has requested consideration of flibanserin (Addyi);  counselled against use given history of fatty liver and side effect profile    Other orders -     PARoxetine HCl; Take 1 tablet (20 mg total) by mouth every morning.  Dispense: 90 tablet; Refill: 1 -     Celecoxib; Take 1 capsule (100 mg total) by mouth 2 (two) times daily.  Dispense: 60 capsule; Refill: 0    Return in about 1 year (around 05/19/2024).  Sherlene Shams, MD

## 2023-05-20 NOTE — Patient Instructions (Signed)
Your shoulder pain sounds like  a rotator cuff issue.  The x ray today will rule out  bone spurring as a contributor  Twice daily  celebrex for  one month.  NO IBUPROFEN  No lifting, > 5 lbs   pulling weeds for 4 weeks  Call if no better:  ortho referral will be placed

## 2023-05-20 NOTE — Assessment & Plan Note (Signed)

## 2023-05-22 ENCOUNTER — Encounter: Payer: Self-pay | Admitting: Internal Medicine

## 2023-05-22 DIAGNOSIS — M25511 Pain in right shoulder: Secondary | ICD-10-CM | POA: Insufficient documentation

## 2023-05-22 DIAGNOSIS — R6882 Decreased libido: Secondary | ICD-10-CM | POA: Insufficient documentation

## 2023-05-22 HISTORY — DX: Pain in right shoulder: M25.511

## 2023-05-22 NOTE — Assessment & Plan Note (Signed)
Occurred while taking a Vitamin D supplement  She did not return for repeat level last year .

## 2023-05-22 NOTE — Assessment & Plan Note (Signed)
LDL is mildly elevated but  10 yr risk  of CAD using E AHA th calculator is < 4% . No treatment recommended except continued participation in aerobic exercise, heart healthy/low GI diet    Lab Results  Component Value Date   CHOL 297 (H) 05/20/2023   HDL 101.00 05/20/2023   LDLCALC 181 (H) 05/20/2023   LDLDIRECT 169.0 05/20/2023   TRIG 71.0 05/20/2023   CHOLHDL 3 05/20/2023

## 2023-05-22 NOTE — Assessment & Plan Note (Addendum)
She denies dyspareunia;  vaginal dryness is managed with OTC lubricants.  She has requested consideration of flibanserin (Addyi);  counselled against use given history of fatty liver and side effect profile

## 2023-05-22 NOTE — Assessment & Plan Note (Addendum)
Exam suggestive of rotator cuff syndrome.  Plain film orered to rule out contribution fro m bone spurring .  Trial of celebrex in lieu of ibuprofen.

## 2023-05-22 NOTE — Assessment & Plan Note (Signed)
Managed with ultra low carb diet and weight loss with normalization of BMI.   Liver enzymes are normal   Lab Results  Component Value Date   ALT 16 05/20/2023   AST 15 05/20/2023   ALKPHOS 72 05/20/2023   BILITOT 0.7 05/20/2023

## 2023-05-24 ENCOUNTER — Other Ambulatory Visit: Payer: Self-pay

## 2023-05-24 ENCOUNTER — Encounter: Payer: Self-pay | Admitting: Internal Medicine

## 2023-05-24 NOTE — Telephone Encounter (Signed)
The xray has not been read by radiology yet.

## 2023-05-27 DIAGNOSIS — L814 Other melanin hyperpigmentation: Secondary | ICD-10-CM | POA: Diagnosis not present

## 2023-05-27 DIAGNOSIS — L298 Other pruritus: Secondary | ICD-10-CM | POA: Diagnosis not present

## 2023-05-27 DIAGNOSIS — L820 Inflamed seborrheic keratosis: Secondary | ICD-10-CM | POA: Diagnosis not present

## 2023-05-27 DIAGNOSIS — D2372 Other benign neoplasm of skin of left lower limb, including hip: Secondary | ICD-10-CM | POA: Diagnosis not present

## 2023-07-05 ENCOUNTER — Telehealth: Payer: Self-pay | Admitting: Internal Medicine

## 2023-07-06 ENCOUNTER — Other Ambulatory Visit: Payer: Self-pay

## 2023-07-06 ENCOUNTER — Other Ambulatory Visit: Payer: Self-pay | Admitting: Internal Medicine

## 2023-07-06 MED ORDER — ALPRAZOLAM 0.5 MG PO TABS
0.5000 mg | ORAL_TABLET | Freq: Every evening | ORAL | 5 refills | Status: AC | PRN
  Filled 2023-07-06: qty 15, 30d supply, fill #0
  Filled 2023-09-28: qty 15, 30d supply, fill #1

## 2023-07-06 NOTE — Telephone Encounter (Signed)
Duplicate request. Needs refusing.

## 2023-07-06 NOTE — Telephone Encounter (Signed)
Requesting: Alprazolam Contract: No UDS: No Last Visit: 05/20/2023 Next Visit: 07/06/2023 Last Refill: 11/24/2022  Please Advise

## 2023-07-06 NOTE — Addendum Note (Signed)
Addended by: Sherlene Shams on: 07/06/2023 09:42 AM   Modules accepted: Orders

## 2023-08-25 ENCOUNTER — Other Ambulatory Visit: Payer: Self-pay | Admitting: Internal Medicine

## 2023-08-25 DIAGNOSIS — Z1231 Encounter for screening mammogram for malignant neoplasm of breast: Secondary | ICD-10-CM

## 2023-09-01 ENCOUNTER — Ambulatory Visit
Admission: RE | Admit: 2023-09-01 | Discharge: 2023-09-01 | Disposition: A | Discharge status: Home / Self Care | Payer: 59 | Source: Ambulatory Visit | Attending: Internal Medicine | Admitting: Internal Medicine

## 2023-09-01 DIAGNOSIS — Z1231 Encounter for screening mammogram for malignant neoplasm of breast: Secondary | ICD-10-CM | POA: Diagnosis not present

## 2023-09-29 ENCOUNTER — Other Ambulatory Visit: Payer: Self-pay

## 2023-10-20 DIAGNOSIS — D2272 Melanocytic nevi of left lower limb, including hip: Secondary | ICD-10-CM | POA: Diagnosis not present

## 2023-10-20 DIAGNOSIS — R208 Other disturbances of skin sensation: Secondary | ICD-10-CM | POA: Diagnosis not present

## 2023-10-20 DIAGNOSIS — D2372 Other benign neoplasm of skin of left lower limb, including hip: Secondary | ICD-10-CM | POA: Diagnosis not present

## 2023-12-22 DIAGNOSIS — Z961 Presence of intraocular lens: Secondary | ICD-10-CM | POA: Diagnosis not present

## 2023-12-22 DIAGNOSIS — H26492 Other secondary cataract, left eye: Secondary | ICD-10-CM | POA: Diagnosis not present

## 2024-01-09 ENCOUNTER — Other Ambulatory Visit: Payer: Self-pay | Admitting: Internal Medicine

## 2024-01-10 ENCOUNTER — Other Ambulatory Visit: Payer: Self-pay | Admitting: Internal Medicine

## 2024-01-10 ENCOUNTER — Other Ambulatory Visit: Payer: Self-pay

## 2024-01-11 ENCOUNTER — Other Ambulatory Visit: Payer: Self-pay

## 2024-01-11 NOTE — Telephone Encounter (Signed)
 Refilled: 07/06/2023 Last OV: 05/20/2023 Next OV: 05/25/2024

## 2024-01-12 ENCOUNTER — Other Ambulatory Visit: Payer: Self-pay

## 2024-01-12 MED FILL — Alprazolam Tab 0.5 MG: ORAL | 30 days supply | Qty: 15 | Fill #0 | Status: AC

## 2024-01-14 ENCOUNTER — Other Ambulatory Visit: Payer: Self-pay

## 2024-02-21 ENCOUNTER — Ambulatory Visit: Payer: Self-pay

## 2024-02-21 NOTE — Telephone Encounter (Signed)
 Chief Complaint: Sinus Congestion Symptoms: fatigue, cough,  Frequency: x  Pertinent Negatives: Patient denies fever, Disposition: [] ED /[] Urgent Care (no appt availability in office) / [x] Appointment(In office/virtual)/ []  [facility]/ [] Home Care/ [] Refused Recommended Disposition /[] [facility]/ []  Follow-up with PCP Additional Notes:Pt reports she was seen in UC on Tuesday and received Dexamethasone  and abx injection, she was prescribed Prednisone , Augmentin and completed both courses with no improvement. Pt continues with Advil, Sudafed, Flonase OTC with no relief. OV scheduled. This RN educated pt on home care, new-worsening symptoms, when to call back/seek emergent care. Pt verbalized understanding and agrees to plan.     Copied from CRM [id number]. Topic: Clinical - Red Word Triage >> Feb 21, 2024  9:58 AM [name] wrote: [facility] that prompted transfer to Nurse Triage: Sinus infection symptoms. Patient stated she was treated at urgent care 4/15, but symptoms are not improving. Experiencing sinus pain, pressure, and fatigue. Also has a lingering cough. Reason for Disposition  [1] Taking antibiotic > 72 hours (3 days) AND [2] sinus pain not improved  Answer Assessment - Initial Assessment Questions 1. ANTIBIOTIC: "What antibiotic are you taking?" "How many times a day?"     Augmentin, Prednisone  2. ONSET: "When was the antibiotic started?"     Tuesday 3. PAIN: "How bad is the sinus pain?"   (Scale 1-10; mild, moderate or severe)   - MILD (1-3): doesn't interfere with normal activities    - MODERATE (4-7): interferes with normal activities (Miller.g., work or school) or awakens from sleep   - SEVERE (8-10): excruciating pain and patient unable to do any normal activities        6/10 4. FEVER: "Do you have a fever?" If Yes, ask: "What is it, how was it measured, and when did it start?"      None 5. SYMPTOMS: "Are there any other symptoms you're concerned about?"  If Yes, ask: "When did it start?"     Fatigue, congested cough, metallic taste, sinus congestion  Protocols used: Sinus Infection on Antibiotic Follow-up Call-[facility]

## 2024-02-22 ENCOUNTER — Encounter: Payer: Self-pay | Admitting: Nurse Practitioner

## 2024-02-22 ENCOUNTER — Ambulatory Visit (INDEPENDENT_AMBULATORY_CARE_PROVIDER_SITE_OTHER): Admitting: Nurse Practitioner

## 2024-02-22 ENCOUNTER — Other Ambulatory Visit: Payer: Self-pay

## 2024-02-22 VITALS — BP 126/84 | HR 84 | Temp 97.9°F | Ht 62.75 in | Wt 143.0 lb

## 2024-02-22 DIAGNOSIS — R5383 Other fatigue: Secondary | ICD-10-CM

## 2024-02-22 DIAGNOSIS — J0140 Acute pansinusitis, unspecified: Secondary | ICD-10-CM | POA: Diagnosis not present

## 2024-02-22 LAB — POCT INFLUENZA A/B
Influenza A, POC: NEGATIVE
Influenza B, POC: NEGATIVE

## 2024-02-22 LAB — POC COVID19 BINAXNOW: SARS Coronavirus 2 Ag: NEGATIVE

## 2024-02-22 MED ORDER — PREDNISONE 10 MG PO TABS
ORAL_TABLET | ORAL | 0 refills | Status: DC
  Filled 2024-02-22: qty 17, 6d supply, fill #0

## 2024-02-22 MED ORDER — DOXYCYCLINE HYCLATE 100 MG PO TABS
100.0000 mg | ORAL_TABLET | Freq: Two times a day (BID) | ORAL | 0 refills | Status: AC
  Filled 2024-02-22: qty 14, 7d supply, fill #0

## 2024-02-22 NOTE — Progress Notes (Signed)
 Established Patient Office Visit  Subjective:  Patient ID: [name], female    DOB: 01/27/1961  Age: 63 y.o. MRN: [id number]  CC:  Chief Complaint  Patient presents with   Acute Visit    On going sinus pain after antibiotics completed    HPI   [name] presents for sinus pressure and fatigue. She was seen at the urgent care in [place] on 01/15/24. She received injection of antibiotic and dexamethasone .  She has completed Augmentin  and prednisone . Since the urgent care visit, she has been feeling fatigue, metallic taste in mouth and foggy.  Medication : sudafed HPI   Past Medical History:  Diagnosis Date   COVID-19 virus detected 09/27/2019   by [name] physician    DDD (degenerative disc disease), cervical    History of colon polyps    History of endometriosis    History of palpitations    History of uterine leiomyoma    Irritable bowel syndrome (IBS)    Lipoma of thigh    left   OA (osteoarthritis)    knees   PONV (postoperative nausea and vomiting)    severe/// per pt becomes hypotensive post-operatively   Prolapsed hemorrhoids    Solitary right kidney 2011   pt donated left kidney to family member    Spigelian hernia 12/2018   left side   Trochanteric bursitis of left hip    Vitamin D  deficiency    Wears contact lenses     Past Surgical History:  Procedure Laterality Date   CATARACT EXTRACTION W/PHACO Right 04/02/2020   Procedure: CATARACT EXTRACTION PHACO AND INTRAOCULAR LENS PLACEMENT (IOC) RIGHT VIVITY TORIC LENS;  Surgeon: Helen Crews, MD;  Location: [facility];  Service: Ophthalmology;  Laterality: Right;  2.09 0:25.6   CATARACT EXTRACTION W/PHACO Left 04/23/2020   Procedure: CATARACT EXTRACTION PHACO AND INTRAOCULAR LENS PLACEMENT (IOC) LEFT PANOPTIX TORIC 4.42 00:28.6;  Surgeon: Helen Crews, MD;  Location: [facility];  Service: Ophthalmology;  Laterality: Left;   COLONOSCOPY  last one 09-18-2013   COLONOSCOPY WITH  PROPOFOL  N/A 09/25/2019   Procedure: COLONOSCOPY WITH PROPOFOL ;  Surgeon: Helen Salaam, MD;  Location: [facility] ENDOSCOPY;  Service: Gastroenterology;  Laterality: N/A;   DILATION AND CURETTAGE OF UTERUS  1993   w/  suction for missed ab   DX LAPAROSCOPY W/ FULGERATION ENDOMETRIOSIS  1993;  1995   HEMORRHOID SURGERY N/A 06/04/2017   Procedure: HEMORRHOIDECTOMY single column and HEMORRHOIDOPEXY;  Surgeon: Helen Nixon, MD;  Location: [facility] ;  Service: General;  Laterality: N/A;   LAPAROSCOPIC VAGINAL HYSTERECTOMY WITH SALPINGO OOPHORECTOMY Bilateral 04-05-2008  dr [name] at [facility]   w/  Fulgeration endometriosis   NEPHRECTOMY LIVING DONOR Left 2011   to family member   TONSILLECTOMY  age 16    Family History  Problem Relation Age of Onset   Mental illness Father 17       bipolar, committed suicide   Breast cancer Sister 107   Cancer Sister 30       melanoma   Hearing loss Daughter    Cancer Paternal Aunt        breast ca   Breast cancer Paternal Aunt    Osteoarthritis Paternal Grandfather    Breast cancer Cousin        1st paternal   Ulcerative colitis Son     Social History   Socioeconomic History   Marital status: Married    Spouse name: Not on file   Number  of children: Not on file   Years of education: Not on file   Highest education level: Not on file  Occupational History   Not on file  Tobacco Use   Smoking status: Never   Smokeless tobacco: Never  Vaping Use   Vaping status: Never Used  Substance and Sexual Activity   Alcohol use: Yes    Alcohol/week: 5.0 standard drinks of alcohol    Types: 5 Shots of liquor per week    Comment: (weekends)   Drug use: No   Sexual activity: Yes    Birth control/protection: Surgical  Other Topics Concern   Not on file  Social History Narrative   Not on file   Social Drivers of Health   Financial Resource Strain: Not on file  Food Insecurity: Not on file  Transportation Needs: Not on file  Physical  Activity: Not on file  Stress: Not on file  Social Connections: Not on file  Intimate Partner Violence: Not on file     Outpatient Medications Prior to Visit  Medication Sig Dispense Refill   ALPRAZolam  (XANAX ) 0.5 MG tablet Take 1 tablet (0.5 mg total) by mouth at bedtime as needed for anxiety 15 tablet 5   celecoxib  (CELEBREX ) 100 MG capsule Take 1 capsule (100 mg total) by mouth 2 (two) times daily. 60 capsule 0   docusate sodium (COLACE) 100 MG capsule Take 100 mg by mouth daily as needed for mild constipation.     Magnesium 400 MG TABS Take 1 tablet by mouth 2 (two) times daily.     PARoxetine  (PAXIL ) 20 MG tablet Take 1 tablet (20 mg total) by mouth every morning. 90 tablet 1   Potassium 99 MG TABS Take 1 tablet by mouth 2 (two) times daily.      Vitamin D -Vitamin K (VITAMIN K2-VITAMIN D3 PO) Take 1 capsule by mouth daily.     No facility-administered medications prior to visit.    No Known Allergies  ROS Review of Systems Negative unless indicated in HPI.    Objective:    Physical Exam Constitutional:      Appearance: Normal appearance.  HENT:     Head: Normocephalic.     Right Ear: A middle ear effusion is present.     Left Ear: A middle ear effusion is present.     Nose:     Right Turbinates: Not enlarged.     Left Turbinates: Not enlarged.     Right Sinus: Maxillary sinus tenderness and frontal sinus tenderness present.     Left Sinus: Maxillary sinus tenderness and frontal sinus tenderness present.     Mouth/Throat:     Mouth: Mucous membranes are moist.     Pharynx: Postnasal drip present. No pharyngeal swelling, oropharyngeal exudate or posterior oropharyngeal erythema.     Tonsils: No tonsillar exudate.  Cardiovascular:     Rate and Rhythm: Normal rate and regular rhythm.  Pulmonary:     Effort: Pulmonary effort is normal.     Breath sounds: Normal breath sounds. No stridor. No wheezing.  Neurological:     General: No focal deficit present.     Mental  Status: She is alert and oriented to person, place, and time. Mental status is at baseline.  Psychiatric:        Mood and Affect: Mood normal.        Behavior: Behavior normal.        Thought Content: Thought content normal.        Judgment:  Judgment normal.     BP 126/84   Pulse 84   Temp 97.9 F (36.6 C)   Ht 5' 2.75" (1.594 m)   Wt 143 lb (64.9 kg)   SpO2 98%   BMI 25.53 kg/m  Wt Readings from Last 3 Encounters:  02/22/24 143 lb (64.9 kg)  05/20/23 140 lb 4 oz (63.6 kg)  07/09/22 140 lb 9.6 oz (63.8 kg)     Health Maintenance  Topic Date Due   HIV Screening  Never done   Zoster Vaccines- Shingrix (1 of 2) Never done   COVID-19 Vaccine (3 - 2024-25 season) 03/08/2024 (Originally 07/04/2023)   INFLUENZA VACCINE  06/02/2024   MAMMOGRAM  08/31/2024   Colonoscopy  09/24/2026   DTaP/Tdap/Td (3 - Td or Tdap) 05/19/2033   Hepatitis C Screening  Completed   HPV VACCINES  Aged Out   Meningococcal B Vaccine  Aged Out    There are no preventive care reminders to display for this patient.  Lab Results  Component Value Date   TSH 1.57 05/20/2023   Lab Results  Component Value Date   WBC 10.1 02/22/2024   HGB 13.4 02/22/2024   HCT 38.3 02/22/2024   MCV 93.2 02/22/2024   PLT 255.0 02/22/2024   Lab Results  Component Value Date   NA 135 02/25/2024   K 3.9 02/25/2024   CO2 30 02/25/2024   GLUCOSE 93 02/25/2024   BUN 20 02/25/2024   CREATININE 1.02 02/25/2024   BILITOT 0.5 02/22/2024   ALKPHOS 77 02/22/2024   AST 14 02/22/2024   ALT 16 02/22/2024   PROT 7.0 02/22/2024   ALBUMIN 4.0 02/22/2024   CALCIUM 9.0 02/25/2024   ANIONGAP 3 (L) 07/18/2013   GFR 58.40 (L) 02/25/2024   Lab Results  Component Value Date   CHOL 297 (H) 05/20/2023   Lab Results  Component Value Date   HDL 101.00 05/20/2023   Lab Results  Component Value Date   LDLCALC 181 (H) 05/20/2023   Lab Results  Component Value Date   TRIG 71.0 05/20/2023   Lab Results  Component Value Date    CHOLHDL 3 05/20/2023   Lab Results  Component Value Date   HGBA1C 5.3 04/23/2021      Assessment & Plan:  Acute pansinusitis, recurrence not specified Assessment & Plan: Pt is afebrile, without respiratoty distress, POCT flu and COVID negative. Given symptoms will treat with Doxycycline  and prednisone .  Advised to take plain Mucinex and increase fluid intake and rest. Will let us  know if symptoms not improving.       Other fatigue -     POC COVID-19 BinaxNow -     POCT Influenza A/B -     CBC with Differential/Platelet -     Comprehensive metabolic panel with GFR  Other orders -     Doxycycline  Hyclate; Take 1 tablet (100 mg total) by mouth 2 (two) times daily for 7 days.  Dispense: 14 tablet; Refill: 0 -     predniSONE ; Take 4 tablets ( total 40 mg) by mouth for 2 days; take 3 tablets ( total 30 mg) by mouth for 2 days; take 2 tablets ( total 20 mg) by mouth for 1 day; take 1 tablet ( total 10 mg) by mouth for 1 day.  Dispense: 17 tablet; Refill: 0    Follow-up: No follow-ups on file.   Taheera Thomann, NP

## 2024-02-22 NOTE — Patient Instructions (Signed)
 Start Doxcycline twice a day, take probiotics to reduce GI side effect. Take prednisone  tapering Take plain Mucinex and continue zyrtec. Incresae fluid intake and rest. Use steam and humidifier.

## 2024-02-23 LAB — CBC WITH DIFFERENTIAL/PLATELET
Basophils Absolute: 0.1 10*3/uL (ref 0.0–0.1)
Basophils Relative: 0.8 % (ref 0.0–3.0)
Eosinophils Absolute: 0.1 10*3/uL (ref 0.0–0.7)
Eosinophils Relative: 1.2 % (ref 0.0–5.0)
HCT: 38.3 % (ref 36.0–46.0)
Hemoglobin: 13.4 g/dL (ref 12.0–15.0)
Lymphocytes Relative: 21.7 % (ref 12.0–46.0)
Lymphs Abs: 2.2 10*3/uL (ref 0.7–4.0)
MCHC: 34.9 g/dL (ref 30.0–36.0)
MCV: 93.2 fl (ref 78.0–100.0)
Monocytes Absolute: 0.6 10*3/uL (ref 0.1–1.0)
Monocytes Relative: 5.5 % (ref 3.0–12.0)
Neutro Abs: 7.2 10*3/uL (ref 1.4–7.7)
Neutrophils Relative %: 70.8 % (ref 43.0–77.0)
Platelets: 255 10*3/uL (ref 150.0–400.0)
RBC: 4.11 Mil/uL (ref 3.87–5.11)
RDW: 15.1 % (ref 11.5–15.5)
WBC: 10.1 10*3/uL (ref 4.0–10.5)

## 2024-02-23 LAB — COMPREHENSIVE METABOLIC PANEL WITH GFR
ALT: 16 U/L (ref 0–35)
AST: 14 U/L (ref 0–37)
Albumin: 4 g/dL (ref 3.5–5.2)
Alkaline Phosphatase: 77 U/L (ref 39–117)
BUN: 16 mg/dL (ref 6–23)
CO2: 30 meq/L (ref 19–32)
Calcium: 9.6 mg/dL (ref 8.4–10.5)
Chloride: 91 meq/L — ABNORMAL LOW (ref 96–112)
Creatinine, Ser: 0.87 mg/dL (ref 0.40–1.20)
GFR: 70.68 mL/min (ref >60.00)
Glucose, Bld: 91 mg/dL (ref 70–99)
Potassium: 4.2 meq/L (ref 3.5–5.1)
Sodium: 129 meq/L — ABNORMAL LOW (ref 135–145)
Total Bilirubin: 0.5 mg/dL (ref 0.2–1.2)
Total Protein: 7 g/dL (ref 6.0–8.3)

## 2024-02-24 ENCOUNTER — Encounter: Payer: Self-pay | Admitting: Nurse Practitioner

## 2024-02-25 ENCOUNTER — Other Ambulatory Visit (INDEPENDENT_AMBULATORY_CARE_PROVIDER_SITE_OTHER)

## 2024-02-25 ENCOUNTER — Other Ambulatory Visit: Payer: Self-pay | Admitting: Nurse Practitioner

## 2024-02-25 ENCOUNTER — Telehealth: Payer: Self-pay | Admitting: Internal Medicine

## 2024-02-25 DIAGNOSIS — E871 Hypo-osmolality and hyponatremia: Secondary | ICD-10-CM | POA: Diagnosis not present

## 2024-02-25 DIAGNOSIS — R7989 Other specified abnormal findings of blood chemistry: Secondary | ICD-10-CM

## 2024-02-25 LAB — BASIC METABOLIC PANEL WITH GFR
BUN: 20 mg/dL (ref 6–23)
CO2: 30 meq/L (ref 19–32)
Calcium: 9 mg/dL (ref 8.4–10.5)
Chloride: 99 meq/L (ref 96–112)
Creatinine, Ser: 1.02 mg/dL (ref 0.40–1.20)
GFR: 58.4 mL/min — ABNORMAL LOW (ref >60.00)
Glucose, Bld: 93 mg/dL (ref 70–99)
Potassium: 3.9 meq/L (ref 3.5–5.1)
Sodium: 135 meq/L (ref 135–145)

## 2024-02-25 NOTE — Telephone Encounter (Signed)
 Hello, this patient is on the lab schedule for this morning and needs a order. Thanks

## 2024-02-25 NOTE — Addendum Note (Signed)
 Addended by: Tona Francis on: 02/25/2024 11:33 AM   Modules accepted: Orders

## 2024-02-27 ENCOUNTER — Encounter: Payer: Self-pay | Admitting: Nurse Practitioner

## 2024-02-28 ENCOUNTER — Encounter: Payer: Self-pay | Admitting: Nurse Practitioner

## 2024-02-28 DIAGNOSIS — J0140 Acute pansinusitis, unspecified: Secondary | ICD-10-CM | POA: Insufficient documentation

## 2024-02-28 DIAGNOSIS — J069 Acute upper respiratory infection, unspecified: Secondary | ICD-10-CM | POA: Insufficient documentation

## 2024-02-28 NOTE — Assessment & Plan Note (Signed)
 Pt is afebrile, without respiratoty distress, POCT flu and COVID negative. Given symptoms will treat with Doxycycline  and prednisone .  Advised to take plain Mucinex and increase fluid intake and rest. Will let us  know if symptoms not improving.

## 2024-04-03 ENCOUNTER — Other Ambulatory Visit: Payer: Self-pay

## 2024-04-03 MED FILL — Alprazolam Tab 0.5 MG: ORAL | 30 days supply | Qty: 15 | Fill #1 | Status: AC

## 2024-04-24 ENCOUNTER — Encounter: Payer: Self-pay | Admitting: Internal Medicine

## 2024-04-24 DIAGNOSIS — G8929 Other chronic pain: Secondary | ICD-10-CM

## 2024-05-25 ENCOUNTER — Encounter: Payer: Self-pay | Admitting: Internal Medicine

## 2024-05-25 ENCOUNTER — Ambulatory Visit (INDEPENDENT_AMBULATORY_CARE_PROVIDER_SITE_OTHER): Payer: 59 | Admitting: Internal Medicine

## 2024-05-25 VITALS — BP 130/82 | HR 65 | Ht 62.75 in | Wt 142.0 lb

## 2024-05-25 DIAGNOSIS — Z0000 Encounter for general adult medical examination without abnormal findings: Secondary | ICD-10-CM | POA: Diagnosis not present

## 2024-05-25 DIAGNOSIS — R7301 Impaired fasting glucose: Secondary | ICD-10-CM | POA: Diagnosis not present

## 2024-05-25 DIAGNOSIS — M25511 Pain in right shoulder: Secondary | ICD-10-CM | POA: Diagnosis not present

## 2024-05-25 DIAGNOSIS — R6882 Decreased libido: Secondary | ICD-10-CM

## 2024-05-25 DIAGNOSIS — Z1231 Encounter for screening mammogram for malignant neoplasm of breast: Secondary | ICD-10-CM

## 2024-05-25 DIAGNOSIS — E785 Hyperlipidemia, unspecified: Secondary | ICD-10-CM | POA: Diagnosis not present

## 2024-05-25 DIAGNOSIS — K760 Fatty (change of) liver, not elsewhere classified: Secondary | ICD-10-CM | POA: Diagnosis not present

## 2024-05-25 DIAGNOSIS — E663 Overweight: Secondary | ICD-10-CM | POA: Diagnosis not present

## 2024-05-25 LAB — COMPREHENSIVE METABOLIC PANEL WITH GFR
ALT: 19 U/L (ref 0–35)
AST: 20 U/L (ref 0–37)
Albumin: 4.6 g/dL (ref 3.5–5.2)
Alkaline Phosphatase: 61 U/L (ref 39–117)
BUN: 19 mg/dL (ref 6–23)
CO2: 31 meq/L (ref 19–32)
Calcium: 9.3 mg/dL (ref 8.4–10.5)
Chloride: 97 meq/L (ref 96–112)
Creatinine, Ser: 0.83 mg/dL (ref 0.40–1.20)
GFR: 74.66 mL/min (ref >60.00)
Glucose, Bld: 86 mg/dL (ref 70–99)
Potassium: 4.1 meq/L (ref 3.5–5.1)
Sodium: 135 meq/L (ref 135–145)
Total Bilirubin: 0.7 mg/dL (ref 0.2–1.2)
Total Protein: 7.5 g/dL (ref 6.0–8.3)

## 2024-05-25 LAB — LIPID PANEL
Cholesterol: 317 mg/dL — ABNORMAL HIGH (ref 0–200)
HDL: 105.2 mg/dL (ref >39.00)
LDL Cholesterol: 194 mg/dL — ABNORMAL HIGH (ref 0–99)
NonHDL: 212.18
Total CHOL/HDL Ratio: 3
Triglycerides: 89 mg/dL (ref 0.0–149.0)
VLDL: 17.8 mg/dL (ref 0.0–40.0)

## 2024-05-25 LAB — LDL CHOLESTEROL, DIRECT: Direct LDL: 175 mg/dL

## 2024-05-25 LAB — TSH: TSH: 1.12 u[IU]/mL (ref 0.35–5.50)

## 2024-05-25 NOTE — Patient Instructions (Addendum)
 Protect your kidney!  Max out tylenol  first.   You can take 3000 mg of acetominophen (tylenol ) every day safely  In divided doses (750 mg  every 6 hours  Or 1000 mg every 8 hours.)    You might want to try using Relaxium for insomnia  (as seen on TV commercials) . 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 Tell him I sent you.

## 2024-05-25 NOTE — Progress Notes (Signed)
 Patient ID: [name], female    DOB: November 01, 1961  Age: 63 y.o. MRN: [id number]  The patient is here for annual preventive examination and management of other chronic and acute problems.   The risk factors are reflected in the social history.   The roster of all physicians providing medical care to patient - is listed in the Snapshot section of the chart.   Activities of daily living:  The patient is 100% independent in all ADLs: dressing, toileting, feeding as well as independent mobility   Home safety : The patient has smoke detectors in the home. They wear seatbelts.  There are no unsecured firearms at home. There is no violence in the home.    There is no risks for hepatitis, STDs or HIV. There is no   history of blood transfusion. They have no travel history to infectious disease endemic areas of the world.   The patient has seen their dentist in the last six month. They have seen their eye doctor in the last year. The patinet  denies slight hearing difficulty with regard to whispered voices and some television programs.  They have deferred audiologic testing in the last year.  They do not  have excessive sun exposure. Discussed the need for sun protection: hats, long sleeves and use of sunscreen if there is significant sun exposure.    Diet: the importance of a healthy diet is discussed. They do have a healthy diet.   The benefits of regular aerobic exercise were discussed. The patient  exercises  3 to 5 days per week  for  60 minutes.    Depression screen: there are no signs or vegative symptoms of depression- irritability, change in appetite, anhedonia, sadness/tearfullness.   The following portions of the patient's history were reviewed and updated as appropriate: allergies, current medications, past family history, past medical history,  past surgical history, past social history  and problem list.   Visual acuity was not assessed per patient preference since the patient has  regular follow up with an  ophthalmologist. Hearing and body mass index were assessed and reviewed.    During the course of the visit the patient was educated and counseled about appropriate screening and preventive services including : fall prevention , diabetes screening, nutrition counseling, colorectal cancer screening, and recommended immunizations.    Chief Complaint:   1) chronic intermittent right shoulder pain ,  LATERAL LEFT KNEE STINGING PAIN WITH KNEELING.    Review of Symptoms  Patient denies headache, fevers, malaise, unintentional weight loss, skin rash, eye pain, sinus congestion and sinus pain, sore throat, dysphagia,  hemoptysis , cough, dyspnea, wheezing, chest pain, palpitations, orthopnea, edema, abdominal pain, nausea, melena, diarrhea, constipation, flank pain, dysuria, hematuria, urinary  Frequency, nocturia, numbness, tingling, seizures,  Focal weakness, Loss of consciousness,  Tremor, insomnia, depression, anxiety, and suicidal ideation.    Physical Exam:  BP 130/82 (BP Location: Left Arm, Patient Position: Sitting, Cuff Size: Normal)   Pulse 65   Ht 5' 2.75 (1.594 m)   Wt 142 lb (64.4 kg)   SpO2 98%   BMI 25.36 kg/m    Physical Exam Vitals reviewed.  Constitutional:      General: She is not in acute distress.    Appearance: Normal appearance. She is well-developed and normal weight. She is not ill-appearing, toxic-appearing or diaphoretic.  HENT:     Head: Normocephalic.     Right Ear: Tympanic membrane, ear canal and external ear normal. There is no impacted  cerumen.     Left Ear: Tympanic membrane, ear canal and external ear normal. There is no impacted cerumen.     Nose: Nose normal.     Mouth/Throat:     Mouth: Mucous membranes are moist.     Pharynx: Oropharynx is clear.  Eyes:     General: No scleral icterus.       Right eye: No discharge.        Left eye: No discharge.     Conjunctiva/sclera: Conjunctivae normal.     Pupils: Pupils are  equal, round, and reactive to light.  Neck:     Thyroid : No thyromegaly.     Vascular: No carotid bruit or JVD.  Cardiovascular:     Rate and Rhythm: Normal rate and regular rhythm.     Heart sounds: Normal heart sounds.  Pulmonary:     Effort: Pulmonary effort is normal. No respiratory distress.     Breath sounds: Normal breath sounds.  Chest:  Breasts:    Breasts are symmetrical.     Right: Normal. No swelling, inverted nipple, mass, nipple discharge, skin change or tenderness.     Left: Normal. No swelling, inverted nipple, mass, nipple discharge, skin change or tenderness.  Abdominal:     General: Bowel sounds are normal.     Palpations: Abdomen is soft. There is no mass.     Tenderness: There is no abdominal tenderness. There is no guarding or rebound.  Musculoskeletal:        General: Normal range of motion.     Cervical back: Normal range of motion and neck supple.  Lymphadenopathy:     Cervical: No cervical adenopathy.     Upper Body:     Right upper body: No supraclavicular, axillary or pectoral adenopathy.     Left upper body: No supraclavicular, axillary or pectoral adenopathy.  Skin:    General: Skin is warm and dry.  Neurological:     General: No focal deficit present.     Mental Status: She is alert and oriented to person, place, and time. Mental status is at baseline.  Psychiatric:        Mood and Affect: Mood normal.        Behavior: Behavior normal.        Thought Content: Thought content normal.        Judgment: Judgment normal.     Assessment and Plan: Encounter for screening mammogram for malignant neoplasm of breast -     3D Screening Mammogram, Left and Right; Future  Decreased libido -     TSH  Hyperlipidemia with target LDL less than 160 -     Comprehensive metabolic panel with GFR -     LDL cholesterol, direct -     Lipid panel  Overweight (BMI 25.0-29.9) -     Comprehensive metabolic panel with GFR -     LDL cholesterol, direct -      Lipid panel  Impaired fasting glucose  Nontraumatic shoulder pain, right Assessment & Plan: Exam consistent with rotator cuff syndrome .  REFERRING TO [facility] FOR EVALUATION    Encounter for preventive health examination Assessment & Plan: age appropriate education and counseling updated, referrals for preventative services and immunizations addressed, dietary and smoking counseling addressed, most recent labs reviewed.  I have personally reviewed and have noted:   1) the patient's medical and social history 2) The pt's use of alcohol, tobacco, and illicit drugs 3) The patient's current medications and supplements  4) Functional ability including ADL's, fall risk, home safety risk, hearing and visual impairment 5) Diet and physical activities 6) Evidence for depression or mood disorder 7) The patient's height, weight, and BMI have been recorded in the chart  I have made referrals, and provided counseling and education based on review of the above    Fatty liver Assessment & Plan: Managed with ultra low carb diet and weight loss with normalization of BMI.   Liver enzymes are normal   Lab Results  Component Value Date   ALT 16 02/22/2024   AST 14 02/22/2024   ALKPHOS 77 02/22/2024   BILITOT 0.5 02/22/2024        Return in about 6 months (around 11/25/2024) for med refill (alprazolam ).  Verneita LITTIE Kettering, MD

## 2024-05-25 NOTE — Assessment & Plan Note (Signed)

## 2024-05-25 NOTE — Assessment & Plan Note (Addendum)
 Exam consistent with rotator cuff syndrome .  REFERRING TO [facility] FOR EVALUATION

## 2024-05-25 NOTE — Assessment & Plan Note (Signed)
 Managed with ultra low carb diet and weight loss with normalization of BMI.   Liver enzymes are normal   Lab Results  Component Value Date   ALT 16 02/22/2024   AST 14 02/22/2024   ALKPHOS 77 02/22/2024   BILITOT 0.5 02/22/2024

## 2024-05-28 ENCOUNTER — Ambulatory Visit: Payer: Self-pay | Admitting: Internal Medicine

## 2024-05-28 DIAGNOSIS — E785 Hyperlipidemia, unspecified: Secondary | ICD-10-CM

## 2024-05-28 NOTE — Assessment & Plan Note (Signed)
 LDL is mildly elevated but  10 yr risk  of CAD using  AHA calculator is < 5% . No treatment recommended except continued participation in aerobic exercise, heart healthy/low GI diet    Lab Results  Component Value Date   CHOL 317 (H) 05/25/2024   HDL 105.20 05/25/2024   LDLCALC 194 (H) 05/25/2024   LDLDIRECT 175.0 05/25/2024   TRIG 89.0 05/25/2024   CHOLHDL 3 05/25/2024

## 2024-06-01 DIAGNOSIS — L820 Inflamed seborrheic keratosis: Secondary | ICD-10-CM | POA: Diagnosis not present

## 2024-06-01 DIAGNOSIS — L538 Other specified erythematous conditions: Secondary | ICD-10-CM | POA: Diagnosis not present

## 2024-06-01 DIAGNOSIS — Z08 Encounter for follow-up examination after completed treatment for malignant neoplasm: Secondary | ICD-10-CM | POA: Diagnosis not present

## 2024-06-01 DIAGNOSIS — I788 Other diseases of capillaries: Secondary | ICD-10-CM | POA: Diagnosis not present

## 2024-06-01 DIAGNOSIS — Z85828 Personal history of other malignant neoplasm of skin: Secondary | ICD-10-CM | POA: Diagnosis not present

## 2024-06-01 DIAGNOSIS — D485 Neoplasm of uncertain behavior of skin: Secondary | ICD-10-CM | POA: Diagnosis not present

## 2024-06-01 DIAGNOSIS — L821 Other seborrheic keratosis: Secondary | ICD-10-CM | POA: Diagnosis not present

## 2024-06-01 DIAGNOSIS — L814 Other melanin hyperpigmentation: Secondary | ICD-10-CM | POA: Diagnosis not present

## 2024-06-01 DIAGNOSIS — D2371 Other benign neoplasm of skin of right lower limb, including hip: Secondary | ICD-10-CM | POA: Diagnosis not present

## 2024-06-15 ENCOUNTER — Other Ambulatory Visit: Payer: Self-pay

## 2024-06-15 MED FILL — Alprazolam Tab 0.5 MG: ORAL | 30 days supply | Qty: 15 | Fill #2 | Status: AC

## 2024-08-02 ENCOUNTER — Other Ambulatory Visit: Payer: Self-pay | Admitting: Internal Medicine

## 2024-08-03 ENCOUNTER — Other Ambulatory Visit: Payer: Self-pay

## 2024-08-03 MED ORDER — ALPRAZOLAM 0.5 MG PO TABS
0.5000 mg | ORAL_TABLET | Freq: Every evening | ORAL | 5 refills | Status: DC | PRN
  Filled 2024-08-03: qty 15, 15d supply, fill #0
  Filled 2024-10-22: qty 15, 15d supply, fill #1

## 2024-08-03 MED ORDER — PAROXETINE HCL 20 MG PO TABS
20.0000 mg | ORAL_TABLET | ORAL | 1 refills | Status: DC
  Filled 2024-08-03: qty 90, 90d supply, fill #0

## 2024-08-24 ENCOUNTER — Ambulatory Visit
Admission: EM | Admit: 2024-08-24 | Discharge: 2024-08-24 | Disposition: A | Discharge status: Home / Self Care | Attending: Emergency Medicine | Admitting: Emergency Medicine

## 2024-08-24 ENCOUNTER — Other Ambulatory Visit: Payer: Self-pay

## 2024-08-24 DIAGNOSIS — J3489 Other specified disorders of nose and nasal sinuses: Secondary | ICD-10-CM

## 2024-08-24 DIAGNOSIS — L03032 Cellulitis of left toe: Secondary | ICD-10-CM

## 2024-08-24 DIAGNOSIS — J0100 Acute maxillary sinusitis, unspecified: Secondary | ICD-10-CM

## 2024-08-24 MED ORDER — MUPIROCIN 2 % EX OINT
1.0000 | TOPICAL_OINTMENT | Freq: Two times a day (BID) | CUTANEOUS | 0 refills | Status: DC
  Filled 2024-08-24: qty 22, 11d supply, fill #0

## 2024-08-24 MED ORDER — DOXYCYCLINE HYCLATE 100 MG PO CAPS
100.0000 mg | ORAL_CAPSULE | Freq: Two times a day (BID) | ORAL | 0 refills | Status: AC
  Filled 2024-08-24: qty 14, 7d supply, fill #0

## 2024-08-24 NOTE — ED Triage Notes (Addendum)
 Patient to Urgent Care with complaints of sore ulcers inside of her nose. Intermittently x2 months (most recently for one month). Using sudafed/ advil/ abreva (2 days).   Also w/ paronychia to her left middle toe. Redness and swelling. Symptoms x2 days. Soaked the area/ oil of oregano.

## 2024-08-24 NOTE — Discharge Instructions (Addendum)
 Take the doxycycline  as directed.    Keep the area on your toe and in your nose clean and dry.  Wash them gently twice a day with soap and water.  Then apply the mupirocin ointment.    Follow-up with your primary care provider.

## 2024-08-24 NOTE — ED Provider Notes (Signed)
 [name]    CSN: [id number] Arrival date & time: 08/24/24  1146      History   Chief Complaint Chief Complaint  Patient presents with   Nasal Congestion    HPI [name] is a 63 y.o. female.  Patient presents with a sore inside of her nostril and intermittent sinus congestion, sinus pressure, nasal drainage x 2 months, worse in the last week again.  She has been treating her symptoms with Sudafed, Advil, Abreva applied to nasal sore.  No fever, cough, shortness of breath.  Patient reports history of recurrent sinus infections for several years.    Patient also presents with a painful, red, swollen area on her left third toe beside her toenail.  She attempted treatment by trying to lift the edge of her toenail, thinking that she had an ingrown toenail.  No purulent drainage, numbness, weakness.  Patient reports history of ingrown toenails.  Patient's medical history includes acquired solitary kidney, nephrectomy living donor 2011.  The history is provided by the patient and medical records.    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   left side   Trochanteric bursitis of left hip    Vitamin D  deficiency    Wears contact lenses     Patient Active Problem List   Diagnosis Date Noted   Nontraumatic shoulder pain, right 05/22/2023   Decreased libido without sexual dysfunction 05/22/2023   Low back pain 08/10/2022    Hypervitaminosis D 04/24/2021   Ventral hernia 10/19/2019   Tubular adenoma of colon 10/19/2019   History of 2019 novel coronavirus disease (COVID-19) 10/19/2019   Fatty liver 12/08/2018   Hyperlipidemia with target LDL less than 160 09/11/2018   Encounter for hepatitis C screening test for low risk patient 09/07/2017   S/P abdominal hysterectomy 12/12/2015   Encounter for preventive health examination 05/11/2013   Solitary kidney, acquired 05/11/2012   Lipoma of lower extremity 05/10/2012   Menopausal syndrome on hormone replacement therapy     Past Surgical History:  Procedure Laterality Date   CATARACT EXTRACTION W/PHACO Right 04/02/2020   Procedure: CATARACT EXTRACTION PHACO AND INTRAOCULAR LENS PLACEMENT (IOC) RIGHT VIVITY TORIC LENS;  Surgeon: Jaye Fallow, MD;  Location: [facility];  Service: Ophthalmology;  Laterality: Right;  2.09 0:25.6   CATARACT EXTRACTION W/PHACO Left 04/23/2020   Procedure: CATARACT EXTRACTION PHACO AND INTRAOCULAR LENS PLACEMENT (IOC) LEFT PANOPTIX TORIC 4.42 00:28.6;  Surgeon: Jaye Fallow, MD;  Location: [facility];  Service: Ophthalmology;  Laterality: Left;   COLONOSCOPY  last one 09-18-2013   COLONOSCOPY WITH PROPOFOL  N/A 09/25/2019   Procedure: COLONOSCOPY WITH PROPOFOL ;  Surgeon: Therisa Bi, MD;  Location: [facility]COPY;  Service: Gastroenterology;  Laterality: N/A;   DILATION AND CURETTAGE OF UTERUS  1993   w/  suction for missed ab   DX LAPAROSCOPY W/  FULGERATION ENDOMETRIOSIS  1993;  1995   HEMORRHOID SURGERY N/A 06/04/2017   Procedure: HEMORRHOIDECTOMY single column and HEMORRHOIDOPEXY;  Surgeon: Debby Hila, MD;  Location: [facility];  Service: General;  Laterality: N/A;   LAPAROSCOPIC VAGINAL HYSTERECTOMY WITH SALPINGO OOPHORECTOMY Bilateral 04-05-2008  dr rockney at [facility]   w/  Fulgeration endometriosis   NEPHRECTOMY LIVING DONOR Left 2011   to family member   TONSILLECTOMY  age 63    OB  History   No obstetric history on file.      Home Medications    Prior to Admission medications   Medication Sig Start Date End Date Taking? Authorizing Provider  doxycycline  (VIBRAMYCIN ) 100 MG capsule Take 1 capsule (100 mg total) by mouth 2 (two) times daily for 7 days. 08/24/24 08/31/24 Yes Corlis Burnard DEL, NP  mupirocin ointment (BACTROBAN) 2 % Apply 1 Application topically 2 (two) times daily. 08/24/24  Yes Corlis Burnard DEL, NP  ALPRAZolam  (XANAX ) 0.5 MG tablet Take 1 tablet (0.5 mg total) by mouth at bedtime as needed for anxiety 08/03/24   Marylynn Verneita CROME, MD  docusate sodium (COLACE) 100 MG capsule Take 100 mg by mouth daily as needed for mild constipation.    [provider]  Magnesium 400 MG TABS Take 1 tablet by mouth 2 (two) times daily.    [provider]  PARoxetine  (PAXIL ) 20 MG tablet Take 1 tablet (20 mg total) by mouth every morning. 08/03/24   Marylynn Verneita CROME, MD  Potassium 99 MG TABS Take 1 tablet by mouth 2 (two) times daily.     [provider]  Vitamin D -Vitamin K (VITAMIN K2-VITAMIN D3 PO) Take 1 capsule by mouth daily.    [provider]    Family History Family History  Problem Relation Age of Onset   Mental illness Father 43       bipolar, committed suicide   Breast cancer Sister 30   Cancer Sister 30       melanoma   Hearing loss Daughter    Cancer Paternal Aunt        breast ca   Breast cancer Paternal Aunt    Osteoarthritis Paternal Grandfather    Breast cancer Cousin        1st paternal   Ulcerative colitis Son     Social History Social History   Tobacco Use   Smoking status: Never   Smokeless tobacco: Never  Vaping Use   Vaping status: Never Used  Substance Use Topics   Alcohol use: Yes    Alcohol/week: 5.0 standard drinks of alcohol    Types: 5 Shots of liquor per week    Comment: (weekends)   Drug use: No     Allergies   Patient has no known allergies.   Review of Systems Review of Systems   Constitutional:  Negative for chills and fever.  HENT:  Positive for congestion, postnasal drip, rhinorrhea, sinus pressure and sinus pain. Negative for ear pain, mouth sores, nosebleeds and sore throat.   Respiratory:  Negative for cough and shortness of breath.   Musculoskeletal:  Negative for arthralgias and joint swelling.  Skin:  Positive for color change and wound.  Neurological:  Negative for weakness and numbness.     Physical Exam Triage Vital Signs ED Triage Vitals  Encounter Vitals Group     BP 08/24/24 1234 139/80     Girls Systolic BP Percentile --      Girls Diastolic BP Percentile --  Boys Systolic BP Percentile --      Boys Diastolic BP Percentile --      Pulse Rate 08/24/24 1234 73     Resp 08/24/24 1234 18     Temp 08/24/24 1234 98.1 F (36.7 C)     Temp src --      SpO2 08/24/24 1234 100 %     Weight --      Height --      Head Circumference --      Peak Flow --      Pain Score 08/24/24 1230 3     Pain Loc --      Pain Education --      Exclude from Growth Chart --    No data found.  Updated Vital Signs BP 139/80   Pulse 73   Temp 98.1 F (36.7 C)   Resp 18   SpO2 100%   Visual Acuity Right Eye Distance:   Left Eye Distance:   Bilateral Distance:    Right Eye Near:   Left Eye Near:    Bilateral Near:     Physical Exam Constitutional:      General: She is not in acute distress. HENT:     Right Ear: Tympanic membrane normal.     Left Ear: Tympanic membrane normal.     Nose: Congestion and rhinorrhea present.     Comments: Small papule in left nostril.    Mouth/Throat:     Mouth: Mucous membranes are moist.     Pharynx: Oropharynx is clear.  Cardiovascular:     Rate and Rhythm: Normal rate and regular rhythm.     Heart sounds: Normal heart sounds.  Pulmonary:     Effort: Pulmonary effort is normal. No respiratory distress.     Breath sounds: Normal breath sounds.  Musculoskeletal:        General: No deformity. Normal range of  motion.  Skin:    General: Skin is warm and dry.     Capillary Refill: Capillary refill takes less than 2 seconds.     Findings: Erythema and lesion present.  Neurological:     General: No focal deficit present.     Mental Status: She is alert.     Sensory: No sensory deficit.     Motor: No weakness.     Gait: Gait normal.      UC Treatments / Results  Labs (all labs ordered are listed, but only abnormal results are displayed) Labs Reviewed - No data to display  EKG   Radiology No results found.  Procedures Incision and Drainage  Date/Time: 08/24/2024 1:17 PM  Performed by: Corlis Burnard DEL, NP Authorized by: Corlis Burnard DEL, NP   Consent:    Consent obtained:  Verbal   Consent given by:  Patient   Risks discussed:  Bleeding, incomplete drainage, pain and infection Universal protocol:    Procedure explained and questions answered to patient or proxy's satisfaction: yes   Location:    Indications for incision and drainage: Paronychia of left third toe.   Location:  Lower extremity   Lower extremity location:  Toe   Toe location:  L third toe Pre-procedure details:    Skin preparation:  Antiseptic wash Anesthesia:    Anesthesia method:  None Procedure type:    Complexity:  Simple Procedure details:    Incision types:  Stab incision   Drainage:  Purulent   Drainage amount:  Moderate   Wound treatment:  Wound left open  Packing materials:  None Post-procedure details:    Procedure completion:  Tolerated well, no immediate complications  (including critical care time)  Medications Ordered in UC Medications - No data to display  Initial Impression / Assessment and Plan / UC Course  I have reviewed the triage vital signs and the nursing notes.  Pertinent labs & imaging results that were available during my care of the patient were reviewed by me and considered in my medical decision making (see chart for details).   Paronychia of left third toe, acute  sinusitis, sore in nostril.  Afebrile and vital signs are stable.  Of note, patient's history includes nephrectomy living donor in 2011.  Paronychia opened and drained.  To cover patient's sinus infection as well as the paronychia while also being mindful of her nephrectomy, treating today with doxycycline .wound care instructions and signs of worsening infection discussed.  Also treating with mupirocin ointment.  Education provided on paronychia and sinus infection.  Instructed patient to follow-up with her PCP.  She agrees to plan of care.   Final Clinical Impressions(s) / UC Diagnoses   Final diagnoses:  Paronychia of third toe, left  Acute non-recurrent maxillary sinusitis  Sore in nostril     Discharge Instructions      Take the doxycycline  as directed.    Keep the area on your toe and in your nose clean and dry.  Wash them gently twice a day with soap and water.  Then apply the mupirocin ointment.    Follow-up with your primary care provider.     ED Prescriptions     Medication Sig Dispense Auth. Provider   mupirocin ointment (BACTROBAN) 2 % Apply 1 Application topically 2 (two) times daily. 22 g Corlis Burnard DEL, NP   doxycycline  (VIBRAMYCIN ) 100 MG capsule Take 1 capsule (100 mg total) by mouth 2 (two) times daily for 7 days. 14 capsule Corlis Burnard DEL, NP      PDMP not reviewed this encounter.   Corlis Burnard DEL, NP 08/24/24 1321

## 2024-10-23 ENCOUNTER — Other Ambulatory Visit: Payer: Self-pay

## 2024-11-06 ENCOUNTER — Ambulatory Visit
Admission: RE | Admit: 2024-11-06 | Discharge: 2024-11-06 | Disposition: A | Discharge status: Home / Self Care | Source: Ambulatory Visit | Attending: Internal Medicine | Admitting: Internal Medicine

## 2024-11-06 DIAGNOSIS — Z1231 Encounter for screening mammogram for malignant neoplasm of breast: Secondary | ICD-10-CM | POA: Diagnosis present

## 2024-11-24 ENCOUNTER — Telehealth: Payer: Self-pay

## 2024-11-24 ENCOUNTER — Other Ambulatory Visit: Payer: Self-pay

## 2024-11-24 MED ORDER — ALPRAZOLAM 0.5 MG PO TABS
0.5000 mg | ORAL_TABLET | Freq: Every evening | ORAL | 0 refills | Status: DC | PRN
  Filled 2024-11-24: qty 15, 30d supply, fill #0

## 2024-11-24 NOTE — Telephone Encounter (Signed)
 I spoke with patient regarding rescheduling her appointment on 11/27/2024, and patient is fine with the change.  Patient states she has been on this medication for around two years and is wondering why she is having to have this type of appointment for refills.  I let her know that I will send a message to find out and someone will reach out to her.

## 2024-11-27 ENCOUNTER — Encounter: Payer: Self-pay | Admitting: Internal Medicine

## 2024-11-27 ENCOUNTER — Other Ambulatory Visit: Payer: Self-pay

## 2024-11-27 ENCOUNTER — Telehealth: Admitting: Internal Medicine

## 2024-11-27 ENCOUNTER — Other Ambulatory Visit: Payer: Self-pay | Admitting: Internal Medicine

## 2024-11-27 ENCOUNTER — Ambulatory Visit: Admitting: Internal Medicine

## 2024-11-27 VITALS — Ht 62.75 in | Wt 143.0 lb

## 2024-11-27 DIAGNOSIS — D126 Benign neoplasm of colon, unspecified: Secondary | ICD-10-CM | POA: Diagnosis not present

## 2024-11-27 DIAGNOSIS — E785 Hyperlipidemia, unspecified: Secondary | ICD-10-CM

## 2024-11-27 DIAGNOSIS — J320 Chronic maxillary sinusitis: Secondary | ICD-10-CM | POA: Insufficient documentation

## 2024-11-27 DIAGNOSIS — K760 Fatty (change of) liver, not elsewhere classified: Secondary | ICD-10-CM | POA: Diagnosis not present

## 2024-11-27 MED ORDER — MUPIROCIN 2 % EX OINT
1.0000 | TOPICAL_OINTMENT | Freq: Two times a day (BID) | CUTANEOUS | 5 refills | Status: AC
  Filled 2024-11-27: qty 22, 30d supply, fill #0

## 2024-11-27 MED ORDER — PAROXETINE HCL 20 MG PO TABS
20.0000 mg | ORAL_TABLET | ORAL | 1 refills | Status: AC
  Filled 2024-11-27 – 2024-11-29 (×2): qty 90, 90d supply, fill #0

## 2024-11-27 MED ORDER — ALPRAZOLAM 0.5 MG PO TABS
0.5000 mg | ORAL_TABLET | Freq: Every evening | ORAL | 5 refills | Status: AC | PRN
  Filled 2024-11-27: qty 20, 30d supply, fill #0

## 2024-11-27 MED ORDER — AMITRIPTYLINE HCL 150 MG PO TABS
75.0000 mg | ORAL_TABLET | Freq: Every day | ORAL | 0 refills | Status: AC
  Filled 2024-11-27 – 2024-11-29 (×2): qty 30, 30d supply, fill #0

## 2024-11-27 NOTE — Assessment & Plan Note (Signed)
 Found during 2020 colonoscopy.  7 yr follow up per GI due in 2027

## 2024-11-27 NOTE — Assessment & Plan Note (Addendum)
 Recurrence may be due to an obstructing sinus polyp and retained saline from daily irrigation.  Advised to use afrin bid x 2 days for next occurrence, and resume daily use of Nasocort.  Adding azelastine spray as well . Will refill mupirocin  ointment for use on ulcers likely due to Staph aureus .  If symptoms recur despite increased adherence to regimen, will need ENT for endoscopy

## 2024-11-27 NOTE — Assessment & Plan Note (Signed)
 Managed with ultra low carb diet and weight loss with normalization of BMI.   Liver enzymes have been normal   Lab Results  Component Value Date   ALT 19 05/25/2024   AST 20 05/25/2024   ALKPHOS 61 05/25/2024   BILITOT 0.7 05/25/2024

## 2024-11-27 NOTE — Progress Notes (Signed)
 Virtual Visit via Caregility   Note   This format is felt to be most appropriate for this patient at this time.  All issues noted in this document were discussed and addressed.  No physical exam was performed (except for noted visual exam findings with Video Visits).   I connected with [name] on 11/27/24 at  1:00 PM EST by a video enabled telemedicine application and verified that I am speaking with the correct person using two identifiers. Location patient: home Location provider: work or home office Persons participating in the virtual visit: patient, provider  I discussed the limitations, risks, security and privacy concerns of performing an evaluation and management service by telephone and the availability of in person appointments. I also discussed with the patient that there may be a patient responsible charge related to this service. The patient expressed understanding and agreed to proceed.   Reason for visit: follow up on multiple issues  HPI: [name] is a 64 yr old female with a history of   1) chronic insomnia with prior failed trials of trazodone and melatonin/GABA presents for refill of alprazolam  0.5 mg ,last refill Dec 22 .  She states that she uses at 1/2 tablet nightly  most nights,  but occasionally requires a full tablet.  Prior trial of trazodone caused abdominal pain.  She is aware of the addictive potential of alprazolam  and is requesting a ttrial  of elavil  as an alternative  . She takes Paxil  for GAD but continues to have trouble relaxing at night.    2) recurrent right maxillary sinus pain .  [name] reports that for the last 3 years,  since her first COVID infection , she has had recurrent episodes of fullness and pain involving the right maxillary sinus.  At times the area under her right eye becomes tender and discolored, and she also gets recurrent painful nasal ulcers.  She has been treated periodically by [facility] with oral abx, Nasocort, and topical mupirocin . She has  been inconsistent with use of nasocort but does flush daily with [facility] sinus rinse. She has not had bloody rhinorrhea , fevers or headaches   3) left knee pain and right shoulder pain: both have resolved.   ROS: See pertinent positives and negatives per HPI.  Past Medical History:  Diagnosis Date   COVID-19 virus detected 09/27/2019   by [facility] physician    DDD (degenerative disc disease), cervical    History of colon polyps    History of endometriosis    History of palpitations    History of uterine leiomyoma    Irritable bowel syndrome (IBS)    Lipoma of thigh    left   Nontraumatic shoulder pain, right 05/22/2023   OA (osteoarthritis)    knees   PONV (postoperative nausea and vomiting)    severe/// per pt becomes hypotensive post-operatively   Prolapsed hemorrhoids    Solitary right kidney 2011   pt donated left kidney to family member    Spigelian hernia 12/2018   left side   Trochanteric bursitis of left hip    Vitamin D  deficiency    Wears contact lenses     Past Surgical History:  Procedure Laterality Date   CATARACT EXTRACTION W/PHACO Right 04/02/2020   Procedure: CATARACT EXTRACTION PHACO AND INTRAOCULAR LENS PLACEMENT (IOC) RIGHT VIVITY TORIC LENS;  Surgeon: Jaye Fallow, MD;  Location: [facility];  Service: Ophthalmology;  Laterality: Right;  2.09 0:25.6   CATARACT EXTRACTION [facility] Left 04/23/2020  Procedure: CATARACT EXTRACTION PHACO AND INTRAOCULAR LENS PLACEMENT (IOC) LEFT PANOPTIX TORIC 4.42 00:28.6;  Surgeon: Jaye Fallow, MD;  Location: [facility];  Service: Ophthalmology;  Laterality: Left;   COLONOSCOPY  last one 09-18-2013   COLONOSCOPY WITH PROPOFOL  N/A 09/25/2019   Procedure: COLONOSCOPY WITH PROPOFOL ;  Surgeon: Therisa Bi, MD;  Location: [facility];  Service: Gastroenterology;  Laterality: N/A;   DILATION AND CURETTAGE OF UTERUS  1993   w/  suction for missed ab   DX LAPAROSCOPY W/ FULGERATION ENDOMETRIOSIS   1993;  1995   HEMORRHOID SURGERY N/A 06/04/2017   Procedure: HEMORRHOIDECTOMY single column and HEMORRHOIDOPEXY;  Surgeon: Debby Hila, MD;  Location: [facility];  Service: General;  Laterality: N/A;   LAPAROSCOPIC VAGINAL HYSTERECTOMY WITH SALPINGO OOPHORECTOMY Bilateral 04-05-2008  dr rockney at [facility]   w/  Fulgeration endometriosis   NEPHRECTOMY LIVING DONOR Left 2011   to family member   TONSILLECTOMY  age 74    Family History  Problem Relation Age of Onset   Mental illness Father 76       bipolar, committed suicide   Breast cancer Sister 65   Cancer Sister 30       melanoma   Hearing loss Daughter    Cancer Paternal Aunt        breast ca   Breast cancer Paternal Aunt    Osteoarthritis Paternal Grandfather    Breast cancer Cousin        1st paternal   Ulcerative colitis Son     SOCIAL HX:  reports that she has never smoked. She has never used smokeless tobacco. She reports current alcohol use of about 5.0 standard drinks of alcohol per week. She reports that she does not use drugs.   Current Medications[1]  EXAM:  VITALS per patient if applicable:  GENERAL: alert, oriented, appears well and in no acute distress  HEENT: atraumatic, conjunttiva clear, no obvious abnormalities on inspection of external nose and ears  NECK: normal movements of the head and neck  LUNGS: on inspection no signs of respiratory distress, breathing rate appears normal, no obvious gross SOB, gasping or wheezing  CV: no obvious cyanosis  MS: moves all visible extremities without noticeable abnormality  PSYCH/NEURO: pleasant and cooperative, no obvious depression or anxiety, speech and thought processing grossly intact  ASSESSMENT AND PLAN: Right maxillary sinusitis, chronic Assessment & Plan: Recurrence may be due to an obstructing sinus polyp and retained saline from daily irrigation.  Advised to use afrin bid x 2 days for next occurrence, and resume daily use of Nasocort.   Adding azelastine spray as well . Will refill mupirocin  ointment for use on ulcers likely due to Staph aureus .  If symptoms recur despite increased adherence to regimen, will need ENT for endoscopy   Fatty liver Assessment & Plan: Managed with ultra low carb diet and weight loss with normalization of BMI.   Liver enzymes have been normal   Lab Results  Component Value Date   ALT 19 05/25/2024   AST 20 05/25/2024   ALKPHOS 61 05/25/2024   BILITOT 0.7 05/25/2024      Tubular adenoma of colon Assessment & Plan: Found during 2020 colonoscopy.  7 yr follow up per GI due in 2027   Other orders -     PARoxetine  HCl; Take 1 tablet (20 mg total) by mouth every morning.  Dispense: 90 tablet; Refill: 1 -     Amitriptyline  HCl; 0.5 to 1 tablet at bedtime  Dispense: 30 tablet; Refill: 0 -     ALPRAZolam ; Take 1 tablet (0.5 mg total) by mouth at bedtime as needed for anxiety  Dispense: 20 tablet; Refill: 5 -     Mupirocin ; Apply 1 Application topically 2 (two) times daily. For 7 days as needed for nasal ulcers  Dispense: 15 g; Refill: 5      I discussed the assessment and treatment plan with the patient. The patient was provided an opportunity to ask questions and all were answered. The patient agreed with the plan and demonstrated an understanding of the instructions.   The patient was advised to call back or seek an in-person evaluation if the symptoms worsen or if the condition fails to improve as anticipated.   I spent 30 minutes dedicated to the care of this patient on the date of this encounter to include pre-visit review of patient's medical history,  including  imaging studies and labs, face-to-face time with the patient , and post visit ordering of testing and therapeutics.    Verneita LITTIE Kettering, MD      [1]  Current Outpatient Medications:    amitriptyline  (ELAVIL ) 150 MG tablet, 0.5 to 1 tablet at bedtime, Disp: 30 tablet, Rfl: 0   docusate sodium (COLACE) 100 MG capsule, Take  100 mg by mouth daily as needed for mild constipation., Disp: , Rfl:    Magnesium 400 MG TABS, Take 1 tablet by mouth 2 (two) times daily., Disp: , Rfl:    Potassium 99 MG TABS, Take 1 tablet by mouth 2 (two) times daily. , Disp: , Rfl:    Vitamin D -Vitamin K (VITAMIN K2-VITAMIN D3 PO), Take 1 capsule by mouth daily., Disp: , Rfl:    ALPRAZolam  (XANAX ) 0.5 MG tablet, Take 1 tablet (0.5 mg total) by mouth at bedtime as needed for anxiety, Disp: 20 tablet, Rfl: 5   mupirocin  ointment (BACTROBAN ) 2 %, Apply 1 Application topically 2 (two) times daily. For 7 days as needed for nasal ulcers, Disp: 15 g, Rfl: 5   PARoxetine  (PAXIL ) 20 MG tablet, Take 1 tablet (20 mg total) by mouth every morning., Disp: 90 tablet, Rfl: 1

## 2024-11-28 ENCOUNTER — Other Ambulatory Visit: Payer: Self-pay

## 2024-11-29 ENCOUNTER — Other Ambulatory Visit: Payer: Self-pay

## 2024-11-29 ENCOUNTER — Other Ambulatory Visit (HOSPITAL_COMMUNITY): Payer: Self-pay

## 2024-11-30 ENCOUNTER — Other Ambulatory Visit: Payer: Self-pay
